# Patient Record
Sex: Male | Born: 1975 | Race: Black or African American | Hispanic: No | Marital: Single | Smoking: Never smoker
Health system: Southern US, Community
[De-identification: ages and names within clinical notes are randomized; demographics above are authoritative.]

## PROBLEM LIST (undated history)

## (undated) DIAGNOSIS — R7611 Nonspecific reaction to tuberculin skin test without active tuberculosis: Secondary | ICD-10-CM

## (undated) DIAGNOSIS — Z21 Asymptomatic human immunodeficiency virus [HIV] infection status: Secondary | ICD-10-CM

## (undated) DIAGNOSIS — I10 Essential (primary) hypertension: Secondary | ICD-10-CM

## (undated) DIAGNOSIS — B20 Human immunodeficiency virus [HIV] disease: Secondary | ICD-10-CM

## (undated) DIAGNOSIS — E785 Hyperlipidemia, unspecified: Secondary | ICD-10-CM

## (undated) DIAGNOSIS — N181 Chronic kidney disease, stage 1: Secondary | ICD-10-CM

## (undated) HISTORY — DX: Essential (primary) hypertension: I10

## (undated) HISTORY — DX: Chronic kidney disease, stage 1: N18.1

## (undated) HISTORY — DX: Human immunodeficiency virus (HIV) disease: B20

## (undated) HISTORY — DX: Hyperlipidemia, unspecified: E78.5

## (undated) HISTORY — DX: Asymptomatic human immunodeficiency virus (hiv) infection status: Z21

## (undated) HISTORY — PX: NO PAST SURGERIES: SHX2092

---

## 1995-12-05 DIAGNOSIS — R7611 Nonspecific reaction to tuberculin skin test without active tuberculosis: Secondary | ICD-10-CM

## 1995-12-05 HISTORY — DX: Nonspecific reaction to tuberculin skin test without active tuberculosis: R76.11

## 2011-01-30 ENCOUNTER — Emergency Department (HOSPITAL_COMMUNITY)
Admission: EM | Admit: 2011-01-30 | Discharge: 2011-01-30 | Disposition: A | Discharge status: Home / Self Care | Payer: Medicaid Other | Attending: Emergency Medicine | Admitting: Emergency Medicine

## 2011-01-30 DIAGNOSIS — Z21 Asymptomatic human immunodeficiency virus [HIV] infection status: Secondary | ICD-10-CM | POA: Insufficient documentation

## 2011-01-30 DIAGNOSIS — Z79899 Other long term (current) drug therapy: Secondary | ICD-10-CM | POA: Insufficient documentation

## 2011-01-30 DIAGNOSIS — J029 Acute pharyngitis, unspecified: Secondary | ICD-10-CM | POA: Insufficient documentation

## 2011-01-30 LAB — RAPID STREP SCREEN (MED CTR MEBANE ONLY): Streptococcus, Group A Screen (Direct): NEGATIVE

## 2011-03-12 ENCOUNTER — Ambulatory Visit (INDEPENDENT_AMBULATORY_CARE_PROVIDER_SITE_OTHER): Payer: Medicaid Other

## 2011-03-12 DIAGNOSIS — R7611 Nonspecific reaction to tuberculin skin test without active tuberculosis: Secondary | ICD-10-CM

## 2011-03-12 DIAGNOSIS — Z79899 Other long term (current) drug therapy: Secondary | ICD-10-CM

## 2011-03-12 DIAGNOSIS — Z113 Encounter for screening for infections with a predominantly sexual mode of transmission: Secondary | ICD-10-CM

## 2011-03-12 DIAGNOSIS — Z23 Encounter for immunization: Secondary | ICD-10-CM

## 2011-03-12 DIAGNOSIS — B20 Human immunodeficiency virus [HIV] disease: Secondary | ICD-10-CM

## 2011-03-12 LAB — COMPLETE METABOLIC PANEL WITH GFR
ALT: 33 U/L (ref 0–53)
AST: 26 U/L (ref 0–37)
Albumin: 4 g/dL (ref 3.5–5.2)
Calcium: 8.8 mg/dL (ref 8.4–10.5)
Chloride: 106 mEq/L (ref 96–112)
Creat: 1.19 mg/dL (ref 0.50–1.35)
Potassium: 4.3 mEq/L (ref 3.5–5.3)
Sodium: 140 mEq/L (ref 135–145)
Total Protein: 6.5 g/dL (ref 6.0–8.3)

## 2011-03-12 LAB — CBC WITH DIFFERENTIAL/PLATELET
Basophils Absolute: 0 10*3/uL (ref 0.0–0.1)
Lymphocytes Relative: 38 % (ref 12–46)
Lymphs Abs: 1.2 10*3/uL (ref 0.7–4.0)
Neutro Abs: 1.5 10*3/uL — ABNORMAL LOW (ref 1.7–7.7)
Neutrophils Relative %: 49 % (ref 43–77)
Platelets: 133 10*3/uL — ABNORMAL LOW (ref 150–400)
RBC: 4.74 MIL/uL (ref 4.22–5.81)
RDW: 12.8 % (ref 11.5–15.5)
WBC: 3.1 10*3/uL — ABNORMAL LOW (ref 4.0–10.5)

## 2011-03-12 LAB — LIPID PANEL
LDL Cholesterol: 161 mg/dL — ABNORMAL HIGH (ref 0–99)
Triglycerides: 67 mg/dL (ref <150)

## 2011-03-12 LAB — URINALYSIS
Hgb urine dipstick: NEGATIVE
Ketones, ur: NEGATIVE mg/dL
Leukocytes, UA: NEGATIVE
Nitrite: NEGATIVE
Specific Gravity, Urine: 1.025 (ref 1.005–1.030)
Urobilinogen, UA: 1 mg/dL (ref 0.0–1.0)

## 2011-03-12 LAB — HEPATITIS B SURFACE ANTIGEN: Hepatitis B Surface Ag: NEGATIVE

## 2011-03-12 LAB — HEPATITIS C ANTIBODY: HCV Ab: NEGATIVE

## 2011-03-13 LAB — GC/CHLAMYDIA PROBE AMP, URINE
Chlamydia, Swab/Urine, PCR: NEGATIVE
GC Probe Amp, Urine: NEGATIVE

## 2011-03-13 LAB — HEPATITIS A ANTIBODY, TOTAL: Hep A Total Ab: NEGATIVE

## 2011-03-14 LAB — HIV 1/2 CONFIRMATION: HIV-2 Ab: NEGATIVE

## 2011-03-14 LAB — HIV-1 RNA QUANT-NO REFLEX-BLD: HIV 1 RNA Quant: 372 copies/mL — ABNORMAL HIGH (ref <20)

## 2011-03-18 DIAGNOSIS — B20 Human immunodeficiency virus [HIV] disease: Secondary | ICD-10-CM | POA: Insufficient documentation

## 2011-03-18 NOTE — Progress Notes (Signed)
Pt has been without medication for 3 weeks prior to today.   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

## 2011-03-26 ENCOUNTER — Encounter: Payer: Self-pay | Admitting: Internal Medicine

## 2011-03-26 ENCOUNTER — Ambulatory Visit (INDEPENDENT_AMBULATORY_CARE_PROVIDER_SITE_OTHER): Payer: Medicaid Other | Admitting: Internal Medicine

## 2011-03-26 DIAGNOSIS — Z79899 Other long term (current) drug therapy: Secondary | ICD-10-CM

## 2011-03-26 DIAGNOSIS — R7611 Nonspecific reaction to tuberculin skin test without active tuberculosis: Secondary | ICD-10-CM

## 2011-03-26 DIAGNOSIS — Z23 Encounter for immunization: Secondary | ICD-10-CM

## 2011-03-26 DIAGNOSIS — B20 Human immunodeficiency virus [HIV] disease: Secondary | ICD-10-CM

## 2011-03-26 DIAGNOSIS — E7800 Pure hypercholesterolemia, unspecified: Secondary | ICD-10-CM

## 2011-03-26 MED ORDER — PRAVASTATIN SODIUM 20 MG PO TABS: 20.0000 mg | ORAL_TABLET | Freq: Every evening | ORAL | Status: DC

## 2011-03-26 MED ORDER — EFAVIRENZ-EMTRICITAB-TENOFOVIR 600-200-300 MG PO TABS: 1.0000 | ORAL_TABLET | Freq: Every day | ORAL | Status: DC

## 2011-03-26 MED ORDER — OMEGA-3 FATTY ACIDS 1000 MG PO CAPS: 2.0000 g | ORAL_CAPSULE | Freq: Two times a day (BID) | ORAL | Status: DC
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
## 2011-03-26 NOTE — Assessment & Plan Note (Signed)
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 He will have chest x-ray checked next visit for screening.

## 2011-03-26 NOTE — Assessment & Plan Note (Addendum)
After further discussion the patient does. This has been his only regimen that is ever been on and he has taken it well and has never developed any resistance dictations that he knows of. Looking through the records is no sign of any resistance mutations therefore I do feel he is eligible for any medication regimen. I did discuss the different options with him and we have decided on the Atripla which she will start today. I did discuss the side effects including C. And S. Disturbances which typically wear off, as well as rash, and kidney and liver side effects. I also discussed that he will take it on an empty stomach. He does appear motivated to start a new regimen and does feel he is very able to take a medicine every day and he has shown that by his long history of taking medications. Therefore he was prescribed Atripla.  I did discuss with him the need to continue using condoms. I also discussed the long-term effects of HIV and HIV medication including cardiac and renal dysfunction the need to continue to monitor those and control his cholesterol.  He will return in one month for labs to assure his immune system is improving again.

## 2011-03-26 NOTE — Patient Instructions (Signed)
Follow up labs in 4 weeks

## 2011-03-26 NOTE — Assessment & Plan Note (Signed)
The patient has been on pravastatin though he has not taken it since leaving jail. This has been restarted and his cholesterol will be rechecked at her later date. He also has fish oil for triglycerides and this also was arrived today.

## 2011-04-02 ENCOUNTER — Encounter (HOSPITAL_COMMUNITY): Payer: Self-pay | Admitting: Emergency Medicine

## 2011-04-02 ENCOUNTER — Telehealth: Payer: Self-pay | Admitting: *Deleted

## 2011-04-02 ENCOUNTER — Emergency Department (HOSPITAL_COMMUNITY)
Admission: EM | Admit: 2011-04-02 | Discharge: 2011-04-03 | Disposition: A | Discharge status: Home / Self Care | Payer: Medicaid Other | Attending: Emergency Medicine | Admitting: Emergency Medicine

## 2011-04-02 DIAGNOSIS — R05 Cough: Secondary | ICD-10-CM | POA: Insufficient documentation

## 2011-04-02 DIAGNOSIS — R070 Pain in throat: Secondary | ICD-10-CM | POA: Insufficient documentation

## 2011-04-02 DIAGNOSIS — J069 Acute upper respiratory infection, unspecified: Secondary | ICD-10-CM | POA: Insufficient documentation

## 2011-04-02 DIAGNOSIS — R059 Cough, unspecified: Secondary | ICD-10-CM | POA: Insufficient documentation

## 2011-04-02 DIAGNOSIS — R0602 Shortness of breath: Secondary | ICD-10-CM | POA: Insufficient documentation

## 2011-04-02 DIAGNOSIS — Z79899 Other long term (current) drug therapy: Secondary | ICD-10-CM | POA: Insufficient documentation

## 2011-04-02 NOTE — Telephone Encounter (Signed)
States he started the Atripla 03/26/11. Has bad insomnia from this & a HA. Early am wakening & unable to go back to sleep. Most worrisome to him is the moderate SOB in am. States "sometimes it feels like my throat is closing up" then it stops. He has not missed any doses. He is a non-smoker. He is not overly worried about this. Told him he should be seen by a doctor. He has medicaid but no pcp. Does not want to go to UC. Stated he wanted to wait for appt here which is next Monday. Urged him to go to ED if it is worse than usual or if it does not go away promptly upon getting up, while he is waiting for Monday appt.  He states he thinks it is anxiety.  Transferred to front desk

## 2011-04-02 NOTE — ED Notes (Signed)
PT. REPORTS SOB , DRY COUGH , SORE THROAT /SWELLING X 2 DAYS.

## 2011-04-03 ENCOUNTER — Telehealth: Payer: Self-pay

## 2011-04-03 ENCOUNTER — Emergency Department (HOSPITAL_COMMUNITY): Payer: Medicaid Other

## 2011-04-03 ENCOUNTER — Other Ambulatory Visit: Payer: Self-pay | Admitting: Internal Medicine

## 2011-04-03 MED ORDER — AZITHROMYCIN 250 MG PO TABS: 250.0000 mg | ORAL_TABLET | Freq: Every day | ORAL | Status: AC

## 2011-04-03 MED ORDER — ALBUTEROL SULFATE HFA 108 (90 BASE) MCG/ACT IN AERS: 1.0000 | INHALATION_SPRAY | Freq: Four times a day (QID) | RESPIRATORY_TRACT | Status: DC | PRN

## 2011-04-03 MED ORDER — EMTRICITAB-RILPIVIR-TENOFOV DF 200-25-300 MG PO TABS: 1.0000 | ORAL_TABLET | Freq: Every day | ORAL | Status: DC

## 2011-04-03 MED ORDER — ALBUTEROL SULFATE (5 MG/ML) 0.5% IN NEBU
5.0000 mg | INHALATION_SOLUTION | Freq: Once | RESPIRATORY_TRACT | Status: AC
  Administered 2011-04-03: 5 mg via RESPIRATORY_TRACT
  Filled 2011-04-03: qty 1

## 2011-04-03 NOTE — ED Provider Notes (Signed)
Medical screening examination/treatment/procedure(s) were performed by non-physician practitioner and as supervising physician I was immediately available for consultation/collaboration.   Yuriana Gaal, MD 04/03/11 0611 

## 2011-04-03 NOTE — Telephone Encounter (Signed)
Pt says he has not been able to sleep since starting Atripla.  He is awaking through the night with anxiety and shortness of breath. He thinks it is related to the Atripla according to the insert this could be a side effect.  He stated he also went to the ED for eval on 04-02-11.   Per Dr Luciana Axe pt advised to d/c Atripla.  He will prescribe new medicaiton. Pt should call our office if symptoms continue.

## 2011-04-03 NOTE — ED Provider Notes (Signed)
History     CSN: [id number] Arrival date & time: 04/02/2011  9:33 PM   First MD Initiated Contact with Patient 04/03/11 0010      Chief Complaint  Patient presents with  . Shortness of Breath    (Consider location/radiation/quality/duration/timing/severity/associated sxs/prior treatment) Patient is a 35 y.o. male presenting with shortness of breath. The history is provided by the patient.  Shortness of Breath  Associated symptoms include shortness of breath.   Pt presents to the ED with complaints of SOB, sore throat and a dry cough. He denies fevers, chills, nausea and vomiting. He has magic mouth wash which helps some with his pain. Denies history of asthma.  Past Medical History  Diagnosis Date  . 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 Hyperlipidemia   . HIV infection     History reviewed. No pertinent past surgical history.  No family history on file.  History  Substance Use Topics  . Smoking status: Never Smoker   . Smokeless tobacco: Never Used  . Alcohol Use: No      Review of Systems  Respiratory: Positive for shortness of breath.   All other systems reviewed and are negative.    Allergies  Review of patient's allergies indicates no known allergies.  Home Medications   Current Outpatient Rx  Name Route Sig Dispense Refill  . EFAVIRENZ-EMTRICITAB-TENOFOVIR 600-200-300 MG PO TABS Oral Take 1 tablet by mouth at bedtime. 30 tablet 5  . OMEGA-3 FATTY ACIDS 1000 MG PO CAPS Oral Take 2 capsules (2 g total) by mouth 2 (two) times daily. 30 capsule 5  . PRAVASTATIN SODIUM 20 MG PO TABS Oral Take 1 tablet (20 mg total) by mouth every evening. 30 tablet 5    BP 124/82  Pulse 78  Temp(Src) 97.4 F (36.3 C) (Oral)  Resp 18  SpO2 99%  Physical Exam  Nursing note and vitals reviewed. Constitutional: He is oriented to person, place, and time. He appears well-developed and well-nourished.  HENT:  Head: Normocephalic and atraumatic.  Eyes:  EOM are normal. Pupils are equal, round, and reactive to light.  Neck: Normal range of motion.  Cardiovascular: Normal rate and regular rhythm.   Pulmonary/Chest: Effort normal and breath sounds normal.  Musculoskeletal: Normal range of motion.  Neurological: He is alert and oriented to person, place, and time.  Skin: Skin is warm and dry.    ED Course  Procedures (including critical care time)  Labs Reviewed - No data to display Dg Chest 2 View  04/03/2011  *RADIOLOGY REPORT*  Clinical Data: Cough.  Shortness of breath.  CHEST - 2 VIEW 04/03/2011:  Comparison: None.  Findings: Suboptimal inspiration accounts for crowded bronchovascular markings, especially in the lung bases, and accentuates the cardiac silhouette.  Taking this into account, cardiomediastinal silhouette unremarkable and lungs clear. Bronchovascular markings normal.  No pleural effusions.  Visualized bony thorax intact.  IMPRESSION: Suboptimal inspiration.  No acute cardiopulmonary disease.  Original Report Authenticated By: Arnell Sieving, M.D.     No diagnosis found.    MDM         Dorthula Matas, PA 04/03/11 [phone]

## 2011-04-08 ENCOUNTER — Encounter: Payer: Self-pay | Admitting: Internal Medicine

## 2011-04-08 ENCOUNTER — Ambulatory Visit (INDEPENDENT_AMBULATORY_CARE_PROVIDER_SITE_OTHER): Payer: Medicaid Other | Admitting: Internal Medicine

## 2011-04-08 VITALS — BP 131/84 | HR 84 | Temp 97.8°F | Wt 209.0 lb

## 2011-04-08 DIAGNOSIS — B20 Human immunodeficiency virus [HIV] disease: Secondary | ICD-10-CM

## 2011-04-08 NOTE — Assessment & Plan Note (Signed)
He has been switched to Complera and so far with one dose is tolerating it. I will have him come back in approximately 4 weeks to recheck his CD4 and viral load and have him followup 2 weeks after the lab draw. He was told to call if he has any significant problems with the Complera or other issues that arise. He was reminded of the need for for condom use with all sexual activity.

## 2011-04-08 NOTE — Progress Notes (Signed)
  Subjective:    Patient ID: Edward Archer, male    DOB: Nov 17, 1975, 35 y.o.   MRN: [id number]  HPI he comes in for followup for his HIV. He was recently started on Atripla however he began to have significant side effects including anxiety as well as difficulty sleeping. He then called and he was subsequently changed to Complera verbal consult which he has just started today. He does tell me that he is able to take the medication in the morning and does he eat breakfast daily and prefers a morning regimen. He so far with the one day has had no side effects from the Complera. He did recently go to the emergency room 2 to shortness of breath which he described as having difficulty breathing in. He was evaluated in the emergency room and given antibiotics and an inhaler. He states that it has improved significantly.    Review of Systems  Constitutional: Negative for fever, chills and fatigue.  HENT: Negative for sore throat and trouble swallowing.   Respiratory: Positive for shortness of breath. Negative for cough, choking, chest tightness, wheezing and stridor.   Cardiovascular: Negative for chest pain.  Gastrointestinal: Negative for nausea and diarrhea.  Musculoskeletal: Negative for myalgias and arthralgias.  Skin: Negative for rash.  Neurological: Negative for light-headedness and headaches.  Hematological: Negative for adenopathy.  Psychiatric/Behavioral: Positive for sleep disturbance and dysphoric mood. The patient is nervous/anxious.        With Atripla       Objective:   Physical Exam  Constitutional: He appears well-developed and well-nourished. No distress.  HENT:  Mouth/Throat: Oropharynx is clear and moist. No oropharyngeal exudate.       No throat edema  Eyes: Right eye exhibits no discharge. Left eye exhibits no discharge. No scleral icterus.  Cardiovascular: Normal rate, regular rhythm and normal heart sounds.  Exam reveals no gallop and no friction rub.   No murmur  heard. Pulmonary/Chest: Effort normal and breath sounds normal. No respiratory distress. He has no wheezes.  Abdominal: Soft. Bowel sounds are normal. There is no tenderness.  Lymphadenopathy:    He has no cervical adenopathy.  Skin: Skin is warm and dry. No rash noted.  Psychiatric: He has a normal mood and affect. His behavior is normal.          Assessment & Plan:

## 2011-04-08 NOTE — Patient Instructions (Signed)
Return in 4 weeks for labs and in January for appointment

## 2011-04-25 ENCOUNTER — Other Ambulatory Visit: Payer: Medicaid Other

## 2011-04-29 ENCOUNTER — Telehealth: Payer: Self-pay | Admitting: *Deleted

## 2011-04-29 NOTE — Telephone Encounter (Signed)
He wanted to check on when his appts were. I told him dates & times

## 2011-05-02 ENCOUNTER — Other Ambulatory Visit: Payer: Self-pay | Admitting: Infectious Diseases

## 2011-05-02 ENCOUNTER — Other Ambulatory Visit: Payer: Medicaid Other

## 2011-05-02 ENCOUNTER — Ambulatory Visit: Payer: Medicaid Other | Admitting: Internal Medicine

## 2011-05-02 DIAGNOSIS — B20 Human immunodeficiency virus [HIV] disease: Secondary | ICD-10-CM

## 2011-05-02 LAB — CBC WITH DIFFERENTIAL/PLATELET
Eosinophils Absolute: 0 10*3/uL (ref 0.0–0.7)
Hemoglobin: 15.3 g/dL (ref 13.0–17.0)
Lymphocytes Relative: 43 % (ref 12–46)
Lymphs Abs: 1.4 10*3/uL (ref 0.7–4.0)
MCH: 30.2 pg (ref 26.0–34.0)
Monocytes Relative: 8 % (ref 3–12)
Neutro Abs: 1.5 10*3/uL — ABNORMAL LOW (ref 1.7–7.7)
Neutrophils Relative %: 47 % (ref 43–77)
Platelets: 132 10*3/uL — ABNORMAL LOW (ref 150–400)
RBC: 5.07 MIL/uL (ref 4.22–5.81)
WBC: 3.2 10*3/uL — ABNORMAL LOW (ref 4.0–10.5)

## 2011-05-02 LAB — COMPLETE METABOLIC PANEL WITH GFR
ALT: 49 U/L (ref 0–53)
BUN: 21 mg/dL (ref 6–23)
CO2: 27 mEq/L (ref 19–32)
Calcium: 9 mg/dL (ref 8.4–10.5)
Creat: 1.23 mg/dL (ref 0.50–1.35)
GFR, Est African American: 87 mL/min
Total Bilirubin: 0.8 mg/dL (ref 0.3–1.2)

## 2011-05-03 LAB — T-HELPER CELL (CD4) - (RCID CLINIC ONLY)
CD4 % Helper T Cell: 29 % — ABNORMAL LOW (ref 33–55)
CD4 T Cell Abs: 420 uL (ref 400–2700)

## 2011-05-06 LAB — HIV-1 RNA QUANT-NO REFLEX-BLD: HIV 1 RNA Quant: 27 copies/mL — ABNORMAL HIGH (ref <20)

## 2011-05-08 ENCOUNTER — Other Ambulatory Visit: Payer: Medicaid Other

## 2011-05-09 ENCOUNTER — Other Ambulatory Visit: Payer: Medicaid Other

## 2011-05-16 ENCOUNTER — Ambulatory Visit: Payer: Medicaid Other | Admitting: Internal Medicine

## 2011-05-21 ENCOUNTER — Ambulatory Visit (INDEPENDENT_AMBULATORY_CARE_PROVIDER_SITE_OTHER): Payer: Medicaid Other | Admitting: Internal Medicine

## 2011-05-21 ENCOUNTER — Encounter: Payer: Self-pay | Admitting: Internal Medicine

## 2011-05-21 VITALS — BP 128/74 | HR 80 | Temp 97.9°F | Ht 73.0 in | Wt 215.8 lb

## 2011-05-21 DIAGNOSIS — B20 Human immunodeficiency virus [HIV] disease: Secondary | ICD-10-CM

## 2011-05-21 MED ORDER — CLOTRIMAZOLE 1 % EX CREA: TOPICAL_CREAM | Freq: Two times a day (BID) | CUTANEOUS | Status: DC

## 2011-05-21 NOTE — Patient Instructions (Signed)
Follow up in 3 months

## 2011-05-21 NOTE — Assessment & Plan Note (Signed)
The new regimen is doing well with good CD4 and viral load.  He will continue with this I discussed with him the need for condoms with all sexual activity.  I also discussed the need to increase his activity and exercise as well as healthy eating.  He will return for follow up in 3 months.

## 2011-05-21 NOTE — Progress Notes (Signed)
  Subjective:    Patient ID: Edward Archer, male    DOB: 1976/05/13, 36 y.o.   MRN: [id number]  HPI He comes in for follow up after starting Complera.  He initially was started on Atripla but was switched due to CNS side effects.  Since starting Complera, he has had no issues and is pleased with the new pill.  He does take it with a fatty meal and the only complaint is the weight gain. No recent STIs or OIs.      Review of Systems  Constitutional: Negative for fever, appetite change, fatigue and unexpected weight change.  HENT: Negative for trouble swallowing.   Respiratory: Negative for cough.   Cardiovascular: Negative for leg swelling.  Gastrointestinal: Negative for nausea, abdominal pain and diarrhea.  Genitourinary: Negative for discharge and penile pain.  Musculoskeletal: Negative for myalgias and arthralgias.  Skin: Negative for rash.  Neurological: Negative for headaches.  Hematological: Negative for adenopathy.  Psychiatric/Behavioral: Negative for dysphoric mood. The patient is not nervous/anxious.        Objective:   Physical Exam  Constitutional: He is oriented to person, place, and time. He appears well-developed and well-nourished. No distress.  HENT:  Mouth/Throat: Oropharynx is clear and moist. No oropharyngeal exudate.  Cardiovascular: Normal rate, regular rhythm and normal heart sounds.  Exam reveals no gallop and no friction rub.   No murmur heard. Pulmonary/Chest: Effort normal and breath sounds normal. No respiratory distress. He has no wheezes.  Abdominal: Soft. Bowel sounds are normal. He exhibits no distension. There is no tenderness.  Lymphadenopathy:    He has no cervical adenopathy.  Neurological: He is alert and oriented to person, place, and time.  Skin: Skin is warm and dry. No erythema.  Psychiatric: He has a normal mood and affect. His behavior is normal.          Assessment & Plan:

## 2011-07-18 ENCOUNTER — Other Ambulatory Visit: Payer: Self-pay | Admitting: *Deleted

## 2011-07-18 ENCOUNTER — Ambulatory Visit: Payer: Medicaid Other

## 2011-07-18 DIAGNOSIS — B20 Human immunodeficiency virus [HIV] disease: Secondary | ICD-10-CM

## 2011-07-18 MED ORDER — EMTRICITAB-RILPIVIR-TENOFOV DF 200-25-300 MG PO TABS: 1.0000 | ORAL_TABLET | Freq: Every day | ORAL | Status: DC

## 2011-08-13 ENCOUNTER — Other Ambulatory Visit: Payer: Self-pay | Admitting: *Deleted

## 2011-08-13 DIAGNOSIS — B20 Human immunodeficiency virus [HIV] disease: Secondary | ICD-10-CM

## 2011-08-13 MED ORDER — EMTRICITAB-RILPIVIR-TENOFOV DF 200-25-300 MG PO TABS: 1.0000 | ORAL_TABLET | Freq: Every day | ORAL | Status: DC

## 2011-08-19 ENCOUNTER — Other Ambulatory Visit (INDEPENDENT_AMBULATORY_CARE_PROVIDER_SITE_OTHER): Payer: Self-pay

## 2011-08-19 DIAGNOSIS — B20 Human immunodeficiency virus [HIV] disease: Secondary | ICD-10-CM

## 2011-08-20 LAB — COMPREHENSIVE METABOLIC PANEL
ALT: 44 U/L (ref 0–53)
AST: 33 U/L (ref 0–37)
Albumin: 4.2 g/dL (ref 3.5–5.2)
Alkaline Phosphatase: 56 U/L (ref 39–117)
BUN: 20 mg/dL (ref 6–23)
Chloride: 108 mEq/L (ref 96–112)
Creat: 1.3 mg/dL (ref 0.50–1.35)
Potassium: 4.3 mEq/L (ref 3.5–5.3)

## 2011-08-20 LAB — CBC WITH DIFFERENTIAL/PLATELET
Basophils Absolute: 0 10*3/uL (ref 0.0–0.1)
Basophils Relative: 0 % (ref 0–1)
HCT: 46 % (ref 39.0–52.0)
Lymphocytes Relative: 39 % (ref 12–46)
MCHC: 33 g/dL (ref 30.0–36.0)
Monocytes Absolute: 0.4 10*3/uL (ref 0.1–1.0)
Neutro Abs: 2.4 10*3/uL (ref 1.7–7.7)
Platelets: 155 10*3/uL (ref 150–400)
RDW: 13.2 % (ref 11.5–15.5)
WBC: 4.8 10*3/uL (ref 4.0–10.5)

## 2011-08-21 LAB — HIV-1 RNA QUANT-NO REFLEX-BLD
HIV 1 RNA Quant: 750 copies/mL — ABNORMAL HIGH (ref <20)
HIV-1 RNA Quant, Log: 2.88 {Log} — ABNORMAL HIGH (ref <1.30)

## 2011-09-02 ENCOUNTER — Encounter: Payer: Self-pay | Admitting: Internal Medicine

## 2011-09-02 ENCOUNTER — Ambulatory Visit (INDEPENDENT_AMBULATORY_CARE_PROVIDER_SITE_OTHER): Payer: Self-pay | Admitting: Internal Medicine

## 2011-09-02 VITALS — BP 135/85 | HR 71 | Temp 97.4°F | Ht 73.0 in | Wt 227.0 lb

## 2011-09-02 DIAGNOSIS — Z21 Asymptomatic human immunodeficiency virus [HIV] infection status: Secondary | ICD-10-CM

## 2011-09-02 DIAGNOSIS — Z23 Encounter for immunization: Secondary | ICD-10-CM

## 2011-09-02 DIAGNOSIS — Z79899 Other long term (current) drug therapy: Secondary | ICD-10-CM

## 2011-09-02 DIAGNOSIS — B20 Human immunodeficiency virus [HIV] disease: Secondary | ICD-10-CM

## 2011-09-02 MED ORDER — PRAVASTATIN SODIUM 20 MG PO TABS: 20.0000 mg | ORAL_TABLET | Freq: Every evening | ORAL | Status: DC

## 2011-09-02 MED ORDER — ALBUTEROL SULFATE HFA 108 (90 BASE) MCG/ACT IN AERS: 1.0000 | INHALATION_SPRAY | Freq: Four times a day (QID) | RESPIRATORY_TRACT | Status: DC | PRN

## 2011-09-02 MED ORDER — EMTRICITAB-RILPIVIR-TENOFOV DF 200-25-300 MG PO TABS: 1.0000 | ORAL_TABLET | Freq: Every day | ORAL | Status: DC

## 2011-09-02 NOTE — Assessment & Plan Note (Signed)
Restart Complera, labs in 4 weeks and follow up with me after.  Reminded to use condoms.

## 2011-09-02 NOTE — Progress Notes (Signed)
  Subjective:    Patient ID: Edward Archer, male    DOB: [birth year], 36 y.o.   MRN: [id number]  HPI Recently started on Complera and had good tolerance and excellent compliance.  Unfortunatley, lost medicaid and now is to get [facility].  Has been off about 1 month.  No new issues.    Review of Systems  Constitutional: Negative for fever, fatigue and unexpected weight change.  HENT: Negative for sore throat and trouble swallowing.   Respiratory: Negative for cough, shortness of breath and wheezing.   Cardiovascular: Negative for chest pain, palpitations and leg swelling.  Gastrointestinal: Negative for nausea, abdominal pain and diarrhea.  Musculoskeletal: Negative for myalgias, joint swelling and arthralgias.  Skin: Negative for rash.  Neurological: Negative for dizziness and light-headedness.  Hematological: Negative for adenopathy.  Psychiatric/Behavioral: Negative for dysphoric mood. The patient is not nervous/anxious.        Objective:   Physical Exam  Constitutional: He appears well-developed and well-nourished. No distress.  Cardiovascular: Normal rate, regular rhythm and normal heart sounds.  Exam reveals no gallop and no friction rub.   No murmur heard. Pulmonary/Chest: Effort normal and breath sounds normal. No respiratory distress. He has no wheezes. He has no rales.  Abdominal: Soft. Bowel sounds are normal. He exhibits no distension. There is no tenderness. There is no rebound.          Assessment & Plan:

## 2011-09-03 ENCOUNTER — Telehealth: Payer: Self-pay

## 2011-09-03 NOTE — Telephone Encounter (Signed)
Called Elena Sessoms at [facility] and told her patient could not get meds though he was approved on 08/01/11 - he came in yesterday and told Dr he could not get meds when he went to [employer] [facility] [employer]. I  had him on phone while he was at [facility] on [address] yesterday, and gave them his case # - told Michelle Nasuti that it had been over a month since patient was there and he is not the type to let us know what's going on - found out about  It since he came to Dr yesterday. Michelle Nasuti said they would look into this - I gave her all his info, case # etc. Also, told her this was not the first time something like this has happened and has occurred with other patients who have been on the program for awhile - also, [facility] does not let us know when they need scripts. We have been sending electronically once new patient has been approved, but apparently is not translating into their system.  She said she would call back and let me know what is going on - she said [facility] is also supposed to call them if someone is picking up meds and check on approval status if not in system.

## 2011-10-22 ENCOUNTER — Other Ambulatory Visit: Payer: Self-pay

## 2011-11-05 ENCOUNTER — Encounter: Payer: Self-pay | Admitting: Internal Medicine

## 2011-11-05 ENCOUNTER — Ambulatory Visit (INDEPENDENT_AMBULATORY_CARE_PROVIDER_SITE_OTHER): Payer: Medicaid Other | Admitting: Internal Medicine

## 2011-11-05 VITALS — BP 124/82 | HR 75 | Temp 97.6°F | Ht 73.0 in | Wt 225.0 lb

## 2011-11-05 DIAGNOSIS — B20 Human immunodeficiency virus [HIV] disease: Secondary | ICD-10-CM

## 2011-11-05 DIAGNOSIS — Z23 Encounter for immunization: Secondary | ICD-10-CM

## 2011-11-05 NOTE — Progress Notes (Signed)
  Subjective:    Patient ID: Edward Archer, male    DOB: Nov 27, 1975, 36 y.o.   MRN: [id number]  HPI He comes in for follow up of his HIV. Last year he had started on Complera however he had stopped it at one point after losing his Medicaid. He returned in April of this year and had been off medications for one month. He was then started on the eighth Program and restarted Complera about 2 months ago. His labs do show good immune reconstitution with an improvement in his CD4 count to over 500 and a viral load that is less than 1000. He feels well and likes his regimen. He does feel he is gaining weight due to the need to take it with food. He also tells me that he occasionally takes the dose in the afternoon when normally he takes it in the morning.   Review of Systems  Constitutional: Negative for fever, chills, fatigue and unexpected weight change.  HENT: Negative for sore throat and trouble swallowing.   Respiratory: Negative for cough and shortness of breath.   Cardiovascular: Negative for chest pain, palpitations and leg swelling.  Gastrointestinal: Negative for nausea, abdominal pain and diarrhea.  Musculoskeletal: Negative for myalgias, joint swelling and arthralgias.  Skin: Negative for rash.  Neurological: Negative for dizziness and headaches.  Hematological: Negative for adenopathy.  Psychiatric/Behavioral: Negative for dysphoric mood. The patient is not nervous/anxious.        Objective:   Physical Exam  Constitutional: He appears well-developed and well-nourished. No distress.  HENT:  Mouth/Throat: Oropharynx is clear and moist. No oropharyngeal exudate.  Cardiovascular: Normal rate, regular rhythm and normal heart sounds.  Exam reveals no gallop and no friction rub.   No murmur heard. Pulmonary/Chest: Effort normal and breath sounds normal. No respiratory distress. He has no wheezes. He has no rales.  Abdominal: Soft. Bowel sounds are normal. He exhibits no distension. There is no  tenderness.  Lymphadenopathy:    He has no cervical adenopathy.          Assessment & Plan:

## 2011-11-05 NOTE — Assessment & Plan Note (Signed)
He is doing well with his current regimen and I did encourage him to make sure he takes it at the same time every day. He does voice his understanding and will take it at the same time in the morning with food. I did offer to change the regimen that has less requirements for food however at this time he is interested in continuing with the same regimen. I will followup with him in 3 months to assure continued immune constitution and tolerance. He was reminded to call at anytime if he has significant issues and not stop the medications without calling us first.  He is going to return to his primary physician at [facility].

## 2011-11-18 ENCOUNTER — Emergency Department (HOSPITAL_COMMUNITY)
Admission: EM | Admit: 2011-11-18 | Discharge: 2011-11-18 | Disposition: A | Discharge status: Home / Self Care | Payer: Self-pay | Attending: Emergency Medicine | Admitting: Emergency Medicine

## 2011-11-18 ENCOUNTER — Encounter (HOSPITAL_COMMUNITY): Payer: Self-pay | Admitting: Emergency Medicine

## 2011-11-18 DIAGNOSIS — R109 Unspecified abdominal pain: Secondary | ICD-10-CM | POA: Insufficient documentation

## 2011-11-18 DIAGNOSIS — R10819 Abdominal tenderness, unspecified site: Secondary | ICD-10-CM | POA: Insufficient documentation

## 2011-11-18 DIAGNOSIS — R112 Nausea with vomiting, unspecified: Secondary | ICD-10-CM | POA: Insufficient documentation

## 2011-11-18 LAB — COMPREHENSIVE METABOLIC PANEL
ALT: 48 U/L (ref 0–53)
AST: 34 U/L (ref 0–37)
Albumin: 3.6 g/dL (ref 3.5–5.2)
Calcium: 8.7 mg/dL (ref 8.4–10.5)
Creatinine, Ser: 1.25 mg/dL (ref 0.50–1.35)
Sodium: 137 mEq/L (ref 135–145)

## 2011-11-18 LAB — CBC WITH DIFFERENTIAL/PLATELET
Basophils Absolute: 0 10*3/uL (ref 0.0–0.1)
Eosinophils Absolute: 0.1 10*3/uL (ref 0.0–0.7)
Eosinophils Relative: 3 % (ref 0–5)
Lymphocytes Relative: 47 % — ABNORMAL HIGH (ref 12–46)
MCH: 29.2 pg (ref 26.0–34.0)
MCV: 87.9 fL (ref 78.0–100.0)
Neutrophils Relative %: 39 % — ABNORMAL LOW (ref 43–77)
Platelets: 133 10*3/uL — ABNORMAL LOW (ref 150–400)
RBC: 4.97 MIL/uL (ref 4.22–5.81)
RDW: 13.3 % (ref 11.5–15.5)
WBC: 4.4 10*3/uL (ref 4.0–10.5)

## 2011-11-18 LAB — URINALYSIS, ROUTINE W REFLEX MICROSCOPIC
Hgb urine dipstick: NEGATIVE
Nitrite: NEGATIVE
Protein, ur: NEGATIVE mg/dL
Specific Gravity, Urine: 1.023 (ref 1.005–1.030)
Urobilinogen, UA: 0.2 mg/dL (ref 0.0–1.0)

## 2011-11-18 LAB — LIPASE, BLOOD: Lipase: 22 U/L (ref 11–59)

## 2011-11-18 MED ORDER — GI COCKTAIL ~~LOC~~
30.0000 mL | Freq: Once | ORAL | Status: AC
  Administered 2011-11-18: 30 mL via ORAL
  Filled 2011-11-18: qty 30

## 2011-11-18 MED ORDER — OMEPRAZOLE 20 MG PO CPDR: 20.0000 mg | DELAYED_RELEASE_CAPSULE | Freq: Every day | ORAL | Status: DC

## 2011-11-18 NOTE — ED Provider Notes (Signed)
Complains of epigastric pain onset yesterday feels like "bubbling". Feels like gastric reflux he's had in the past. Vomited 2 times last vomited 2 days ago. A hot toxin hamburgers yesterday. Pain feels much improved since treated with GI cocktail here. On exam no distress lungs clear auscultation heart regular rate and rhythm abdomen nondistended minimally tender at epigastrium no guarding rigidity or rebound.  Doug Sou, MD 11/18/11 1046

## 2011-11-18 NOTE — ED Notes (Signed)
Pt states "my stomach feels like it's in knots".  Pt c/o symptoms x 1 week.

## 2011-11-18 NOTE — ED Provider Notes (Signed)
Medical screening examination/treatment/procedure(s) were conducted as a shared visit with non-physician practitioner(s) and myself.  I personally evaluated the patient during the encounter  Doug Sou, MD 11/18/11 1554

## 2011-11-18 NOTE — ED Provider Notes (Signed)
History     CSN: [id number]  Arrival date & time 11/18/11  4696   First MD Initiated Contact with Patient 11/18/11 [phone]      Chief Complaint  Patient presents with  . Abdominal Pain    (Consider location/radiation/quality/duration/timing/severity/associated sxs/prior treatment) HPI Comments: Patient comes in today with a chief complaint of abdominal pain for the past week.  Pain located in the LUQ and epigastric area.  He describes the pain as a "bubbly" feeling.  He reports that he had two episodes of vomiting two days ago.  No vomiting since that time.  No blood in his emesis.  No diarrhea or constipation.  Last BM was two hours ago and was soft.  No blood in his stool.  No fever or chills.  He reports that he has a prior history of GERD.  He has taken Prilosec for this in the past, but has not taken any medication recently.  He also has a history of HIV and is followed by ID.  His last ID visit was 11/05/11 and everything was thought to be stable at that time.  His last CD4 count was 520 in April 2013.  His PCP is Risk analyst.  Patient is a 36 y.o. male presenting with abdominal pain. The history is provided by the patient.  Abdominal Pain The primary symptoms of the illness include abdominal pain, nausea and vomiting. The primary symptoms of the illness do not include fever, shortness of breath, diarrhea, hematemesis, hematochezia or dysuria. Episode onset: one week ago. The onset of the illness was gradual. The problem has not changed since onset. The patient has not had a change in bowel habit. Symptoms associated with the illness do not include chills, constipation, urgency, hematuria or frequency. Significant associated medical issues include HIV.    Past Medical History  Diagnosis Date  . 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 Hyperlipidemia   . HIV infection     History reviewed. No pertinent past surgical history.  History reviewed. No pertinent family  history.  History  Substance Use Topics  . Smoking status: Never Smoker   . Smokeless tobacco: Never Used  . Alcohol Use: No      Review of Systems  Constitutional: Negative for fever and chills.  Respiratory: Negative for shortness of breath.   Cardiovascular: Negative for chest pain.  Gastrointestinal: Positive for nausea, vomiting and abdominal pain. Negative for diarrhea, constipation, blood in stool, hematochezia, abdominal distention and hematemesis.  Genitourinary: Negative for dysuria, urgency, frequency, hematuria, flank pain, decreased urine volume, penile swelling, scrotal swelling, genital sores, penile pain and testicular pain.  Neurological: Negative for dizziness, syncope and light-headedness.    Allergies  Review of patient's allergies indicates no known allergies.  Home Medications   Current Outpatient Rx  Name Route Sig Dispense Refill  . BISACODYL 5 MG PO TBEC Oral Take 5 mg by mouth daily as needed. For laxative    . EMTRICITAB-RILPIVIR-TENOFOVIR 200-25-300 MG PO TABS Oral Take 1 tablet by mouth daily. 30 tablet 11  . OMEGA-3 FATTY ACIDS 1000 MG PO CAPS Oral Take 2 capsules (2 g total) by mouth 2 (two) times daily. 30 capsule 5    BP 113/71  Pulse 71  Temp 97.6 F (36.4 C) (Oral)  Resp 18  SpO2 98%  Physical Exam  Nursing note and vitals reviewed. Constitutional: He appears well-developed and well-nourished. No distress.  HENT:  Head: Normocephalic and atraumatic.  Mouth/Throat: Oropharynx is clear and  moist.  Neck: Normal range of motion. Neck supple.  Cardiovascular: Normal rate, regular rhythm and normal heart sounds.   Pulmonary/Chest: Effort normal and breath sounds normal.  Abdominal: Soft. Bowel sounds are normal. He exhibits no distension and no mass. There is tenderness in the left upper quadrant. There is no rigidity, no rebound, no guarding and no CVA tenderness.       Very mild tenderness to palpation of the LUQ and epigastric area    Neurological: He is alert.  Skin: Skin is warm and dry. He is not diaphoretic.  Psychiatric: He has a normal mood and affect.    ED Course  Procedures (including critical care time)   Labs Reviewed  URINALYSIS, ROUTINE W REFLEX MICROSCOPIC   No results found.   No diagnosis found.  10:05 AM Reassessed patient.  He reports that his symptoms have improved since he was given GI cocktail. 10:26 AM Patient reports that his pain is improved.  No nausea at this time.  MDM  Patient presenting with epigastric and LUQ abdominal pain.  Two episodes of vomiting two days ago, but no vomiting since that time.  VSS.  Patient afebrile.   No rebound or guarding on exam.  Labs unremarkable.  Pain improved after given GI cocktail.  Therefore, feel that patient can be discharged home.  Patient instructed to take Prilosec and Maalox.  Return precautions discussed.        Pascal Lux [place], PA-C 11/18/11 1129

## 2011-12-03 ENCOUNTER — Ambulatory Visit: Payer: Medicaid Other

## 2012-01-21 ENCOUNTER — Other Ambulatory Visit: Payer: Self-pay | Admitting: Internal Medicine

## 2012-01-21 DIAGNOSIS — Z113 Encounter for screening for infections with a predominantly sexual mode of transmission: Secondary | ICD-10-CM

## 2012-01-21 DIAGNOSIS — Z79899 Other long term (current) drug therapy: Secondary | ICD-10-CM

## 2012-01-23 ENCOUNTER — Other Ambulatory Visit (INDEPENDENT_AMBULATORY_CARE_PROVIDER_SITE_OTHER): Payer: Self-pay

## 2012-01-23 DIAGNOSIS — Z113 Encounter for screening for infections with a predominantly sexual mode of transmission: Secondary | ICD-10-CM

## 2012-01-23 DIAGNOSIS — Z79899 Other long term (current) drug therapy: Secondary | ICD-10-CM

## 2012-01-23 DIAGNOSIS — B20 Human immunodeficiency virus [HIV] disease: Secondary | ICD-10-CM

## 2012-01-23 LAB — CBC WITH DIFFERENTIAL/PLATELET
Basophils Absolute: 0 10*3/uL (ref 0.0–0.1)
Eosinophils Relative: 2 % (ref 0–5)
Lymphocytes Relative: 46 % (ref 12–46)
MCV: 87.4 fL (ref 78.0–100.0)
Neutro Abs: 1.9 10*3/uL (ref 1.7–7.7)
Neutrophils Relative %: 42 % — ABNORMAL LOW (ref 43–77)
Platelets: 161 10*3/uL (ref 150–400)
RDW: 14.2 % (ref 11.5–15.5)
WBC: 4.4 10*3/uL (ref 4.0–10.5)

## 2012-01-24 LAB — COMPLETE METABOLIC PANEL WITH GFR
Albumin: 4.1 g/dL (ref 3.5–5.2)
BUN: 17 mg/dL (ref 6–23)
CO2: 28 mEq/L (ref 19–32)
Calcium: 9 mg/dL (ref 8.4–10.5)
Chloride: 106 mEq/L (ref 96–112)
Creat: 1.3 mg/dL (ref 0.50–1.35)
GFR, Est African American: 81 mL/min
GFR, Est Non African American: 70 mL/min
Glucose, Bld: 100 mg/dL — ABNORMAL HIGH (ref 70–99)
Potassium: 4.2 mEq/L (ref 3.5–5.3)

## 2012-01-24 LAB — T-HELPER CELL (CD4) - (RCID CLINIC ONLY)
CD4 % Helper T Cell: 27 % — ABNORMAL LOW (ref 33–55)
CD4 T Cell Abs: 500 uL (ref 400–2700)

## 2012-01-24 LAB — LIPID PANEL
Total CHOL/HDL Ratio: 4 Ratio
VLDL: 14 mg/dL (ref 0–40)

## 2012-01-24 LAB — RPR

## 2012-01-26 LAB — HIV-1 RNA ULTRAQUANT REFLEX TO GENTYP+: HIV 1 RNA Quant: <20 copies/mL (ref <20)

## 2012-02-06 ENCOUNTER — Encounter: Payer: Self-pay | Admitting: Internal Medicine

## 2012-02-06 ENCOUNTER — Ambulatory Visit (INDEPENDENT_AMBULATORY_CARE_PROVIDER_SITE_OTHER): Payer: Self-pay | Admitting: Internal Medicine

## 2012-02-06 VITALS — BP 120/78 | HR 63 | Temp 97.4°F | Wt 227.0 lb

## 2012-02-06 DIAGNOSIS — Z23 Encounter for immunization: Secondary | ICD-10-CM

## 2012-02-06 DIAGNOSIS — B20 Human immunodeficiency virus [HIV] disease: Secondary | ICD-10-CM

## 2012-02-06 NOTE — Progress Notes (Signed)
  Subjective:    Patient ID: Markevius Trombetta, male    DOB: 1976-03-31, 36 y.o.   MRN: [id number]  HPI He comes in for followup of his HIV. He continues on Complera and has no missed doses. He has good tolerance though he does feel he has gained weight due to the food requirement. He is now undetectable with a good CD4 count.  He does like his regimen and will continue to take.   Review of Systems  Constitutional: Negative for fever, fatigue and unexpected weight change.  HENT: Negative for sore throat and trouble swallowing.   Respiratory: Negative for cough and shortness of breath.   Gastrointestinal: Negative for nausea, abdominal pain and diarrhea.  Musculoskeletal: Negative for myalgias, joint swelling and arthralgias.  Skin: Negative for rash.  Neurological: Negative for dizziness and headaches.  Hematological: Negative for adenopathy.       Objective:   Physical Exam  Constitutional: He appears well-developed and well-nourished. No distress.  HENT:  Mouth/Throat: Oropharynx is clear and moist. No oropharyngeal exudate.  Cardiovascular: Normal rate, regular rhythm and normal heart sounds.  Exam reveals no gallop and no friction rub.   No murmur heard. Pulmonary/Chest: Effort normal and breath sounds normal. No respiratory distress. He has no wheezes. He has no rales.  Abdominal: Soft. Bowel sounds are normal. He exhibits no distension. There is no tenderness. There is no rebound.  Lymphadenopathy:    He has no cervical adenopathy.          Assessment & Plan:

## 2012-02-06 NOTE — Assessment & Plan Note (Addendum)
He is doing well with his regimen. He will monitor use condoms for sexual activity. He will return in 4 months but knows to call sooner if he has any issues. He is going to reestablish with his previous primary care physician.

## 2012-06-02 ENCOUNTER — Other Ambulatory Visit (INDEPENDENT_AMBULATORY_CARE_PROVIDER_SITE_OTHER): Payer: Medicaid Other

## 2012-06-02 ENCOUNTER — Ambulatory Visit: Payer: Self-pay

## 2012-06-02 DIAGNOSIS — B20 Human immunodeficiency virus [HIV] disease: Secondary | ICD-10-CM

## 2012-06-03 LAB — T-HELPER CELL (CD4) - (RCID CLINIC ONLY): CD4 T Cell Abs: 520 uL (ref 400–2700)

## 2012-06-06 ENCOUNTER — Emergency Department (HOSPITAL_COMMUNITY)
Admission: EM | Admit: 2012-06-06 | Discharge: 2012-06-06 | Disposition: A | Discharge status: Home / Self Care | Payer: Medicaid Other | Attending: Emergency Medicine | Admitting: Emergency Medicine

## 2012-06-06 ENCOUNTER — Encounter (HOSPITAL_COMMUNITY): Payer: Self-pay | Admitting: Emergency Medicine

## 2012-06-06 ENCOUNTER — Emergency Department (HOSPITAL_COMMUNITY): Payer: Medicaid Other

## 2012-06-06 DIAGNOSIS — R071 Chest pain on breathing: Secondary | ICD-10-CM | POA: Insufficient documentation

## 2012-06-06 DIAGNOSIS — R079 Chest pain, unspecified: Secondary | ICD-10-CM

## 2012-06-06 DIAGNOSIS — Z79899 Other long term (current) drug therapy: Secondary | ICD-10-CM | POA: Insufficient documentation

## 2012-06-06 DIAGNOSIS — E785 Hyperlipidemia, unspecified: Secondary | ICD-10-CM | POA: Insufficient documentation

## 2012-06-06 DIAGNOSIS — Z21 Asymptomatic human immunodeficiency virus [HIV] infection status: Secondary | ICD-10-CM | POA: Insufficient documentation

## 2012-06-06 HISTORY — DX: Nonspecific reaction to tuberculin skin test without active tuberculosis: R76.11

## 2012-06-06 LAB — POCT I-STAT, CHEM 8
Calcium, Ion: 1.15 mmol/L (ref 1.12–1.23)
Creatinine, Ser: 1.4 mg/dL — ABNORMAL HIGH (ref 0.50–1.35)
Glucose, Bld: 100 mg/dL — ABNORMAL HIGH (ref 70–99)
Hemoglobin: 14.6 g/dL (ref 13.0–17.0)
Sodium: 141 mEq/L (ref 135–145)
TCO2: 24 mmol/L (ref 0–100)

## 2012-06-06 LAB — CBC
HCT: 42.6 % (ref 39.0–52.0)
Hemoglobin: 14.1 g/dL (ref 13.0–17.0)
MCH: 29.6 pg (ref 26.0–34.0)
MCHC: 33.1 g/dL (ref 30.0–36.0)

## 2012-06-06 LAB — POCT I-STAT TROPONIN I

## 2012-06-06 MED ORDER — ASPIRIN 81 MG PO CHEW
324.0000 mg | CHEWABLE_TABLET | Freq: Once | ORAL | Status: AC
  Administered 2012-06-06: 324 mg via ORAL
  Filled 2012-06-06: qty 4

## 2012-06-06 MED ORDER — MORPHINE SULFATE 4 MG/ML IJ SOLN
4.0000 mg | Freq: Once | INTRAMUSCULAR | Status: AC
  Administered 2012-06-06: 4 mg via INTRAVENOUS
  Filled 2012-06-06: qty 1

## 2012-06-06 MED ORDER — HYDROCODONE-ACETAMINOPHEN 5-325 MG PO TABS: 1.0000 | ORAL_TABLET | Freq: Four times a day (QID) | ORAL | Status: DC | PRN

## 2012-06-06 MED ORDER — CYCLOBENZAPRINE HCL 5 MG PO TABS: 5.0000 mg | ORAL_TABLET | Freq: Three times a day (TID) | ORAL | Status: DC | PRN

## 2012-06-06 MED ORDER — SODIUM CHLORIDE 0.9 % IV SOLN
1000.0000 mL | INTRAVENOUS | Status: DC
  Administered 2012-06-06: 1000 mL via INTRAVENOUS

## 2012-06-06 NOTE — ED Notes (Signed)
Pt from home c/o CP with mild SOB. Pt reports L sided CP x4 days that radiates to L side of back. Pt denies vomiting but reports nausea, diaphoresis. Pt in NAD and A&O

## 2012-06-06 NOTE — ED Provider Notes (Addendum)
History     CSN: [id number] Arrival date & time 06/06/12  1244 First MD Initiated Contact with Patient 06/06/12 1305      Chief Complaint  Patient presents with  . Chest Pain    Patient is a 37 y.o. male presenting with chest pain. The history is provided by the patient.  Chest Pain The chest pain began 3 - 5 days ago. Duration of episode(s) is 4 days. Chest pain occurs constantly. The chest pain is unchanged. The pain is associated with coughing (movement). The severity of the pain is moderate. The quality of the pain is described as aching. The pain radiates to the upper back. Chest pain is worsened by deep breathing (coughing, moving). Primary symptoms include cough. Pertinent negatives for primary symptoms include no fever, no shortness of breath, no vomiting and no dizziness.  Pertinent negatives for past medical history include no CAD and no PE.   CD4 counts good.  Viral load undetectable.  Past Medical History  Diagnosis Date  . 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 Hyperlipidemia   . HIV infection     History reviewed. No pertinent past surgical history.  No family history on file.  History  Substance Use Topics  . Smoking status: Never Smoker   . Smokeless tobacco: Never Used  . Alcohol Use: No      Review of Systems  Constitutional: Negative for fever.  Respiratory: Positive for cough. Negative for shortness of breath.   Cardiovascular: Positive for chest pain.  Gastrointestinal: Negative for vomiting.  Neurological: Negative for dizziness.  All other systems reviewed and are negative.    Allergies  Review of patient's allergies indicates no known allergies.  Home Medications   Current Outpatient Rx  Name  Route  Sig  Dispense  Refill  . EMTRICITAB-RILPIVIR-TENOFOVIR 200-25-300 MG PO TABS   Oral   Take 1 tablet by mouth every morning.         . IBUPROFEN 200 MG PO TABS   Oral   Take 800 mg by mouth every 6 (six) hours as needed.  For pain         . PRAVASTATIN SODIUM 20 MG PO TABS   Oral   Take 20 mg by mouth every morning.           BP 133/87  Pulse 81  Temp 98.2 F (36.8 C) (Oral)  Resp 16  Ht 6\' 1"  (1.854 m)  Wt 210 lb (95.255 kg)  BMI 27.71 kg/m2  SpO2 98%  Physical Exam  Nursing note and vitals reviewed. Constitutional: He appears well-developed and well-nourished. No distress.  HENT:  Head: Normocephalic and atraumatic.  Right Ear: External ear normal.  Left Ear: External ear normal.  Eyes: Conjunctivae normal are normal. Right eye exhibits no discharge. Left eye exhibits no discharge. No scleral icterus.  Neck: Neck supple. No tracheal deviation present.  Cardiovascular: Normal rate, regular rhythm and intact distal pulses.   Pulmonary/Chest: Effort normal and breath sounds normal. No stridor. No respiratory distress. He has no wheezes. He has no rales. He exhibits tenderness (left side).  Abdominal: Soft. Bowel sounds are normal. He exhibits no distension. There is no tenderness. There is no rebound and no guarding.  Musculoskeletal: He exhibits no edema and no tenderness.  Neurological: He is alert. He has normal strength. No sensory deficit. Cranial nerve deficit:  no gross defecits noted. He exhibits normal muscle tone. He displays no seizure activity. Coordination normal.  Skin:  Skin is warm and dry. No rash noted.  Psychiatric: He has a normal mood and affect.    ED Course  Procedures (including critical care time) EKG Sinus rhythm, rate 81 Normal axis, normal intervals Probable left atrial Melody Normal ST T waves No prior EKG for comparison  Labs Reviewed  POCT I-STAT, CHEM 8 - Abnormal; Notable for the following:    Creatinine, Ser 1.40 (*)     Glucose, Bld 100 (*)     All other components within normal limits  CBC  D-DIMER, QUANTITATIVE  POCT I-STAT TROPONIN I   Dg Chest 2 View  06/06/2012  *RADIOLOGY REPORT*  Clinical Data: Chest pain  CHEST - 2 VIEW  Comparison:  04/03/2011  Findings: Borderline cardiomegaly.  No acute infiltrate or pulmonary edema.  Bony thorax is stable.  No pleural effusion.  IMPRESSION: Borderline cardiomegaly.  No active disease.   Original Report Authenticated By: Natasha Mead, M.D.      1. Chest pain       MDM  Pt's chest pain is atypical for cardiac disease.  It has been constant for four days.  Troponin and d dimer negative.  Doubt PE, ACS.  No PNA noted on CXR.    Pt does have some chest wall ttp.  Will dc home with pain medications.  Follow up with PCP next week if symptoms persist.    Celene Kras, MD 06/06/12 1440  Discussed findings with patient.  He requested a muscle relaxer.  Celene Kras, MD 06/06/12 [phone]

## 2012-06-16 ENCOUNTER — Ambulatory Visit (INDEPENDENT_AMBULATORY_CARE_PROVIDER_SITE_OTHER): Payer: Medicaid Other | Admitting: Internal Medicine

## 2012-06-16 ENCOUNTER — Encounter: Payer: Self-pay | Admitting: Internal Medicine

## 2012-06-16 VITALS — BP 130/88 | HR 77 | Temp 98.3°F | Ht 73.0 in | Wt 227.0 lb

## 2012-06-16 DIAGNOSIS — Z113 Encounter for screening for infections with a predominantly sexual mode of transmission: Secondary | ICD-10-CM

## 2012-06-16 DIAGNOSIS — B20 Human immunodeficiency virus [HIV] disease: Secondary | ICD-10-CM

## 2012-06-16 DIAGNOSIS — Z79899 Other long term (current) drug therapy: Secondary | ICD-10-CM

## 2012-06-16 NOTE — Assessment & Plan Note (Signed)
He is doing well and will return in 6 months for fasting labs. He knows to call us if he has any problems in the meantime as we can see him anytime during the week in lieu of the emergency room if not a true emergency.

## 2012-06-16 NOTE — Progress Notes (Signed)
  Subjective:    Patient ID: Edward Archer, male    DOB: Nov 18, 1975, 37 y.o.   MRN: [id number]  HPI He comes in for followup of his HIV. He continues on Complera has excellent compliance and eats it with a 400-calorie meal in the a.m. Which she likes to do. He continues to be undetectable with a normal CD4 count. He was seen in the emergency room with muscle strain and this has resolved.   Review of Systems  Constitutional: Negative for fever, activity change and appetite change.  HENT: Negative for sore throat and trouble swallowing.   Respiratory: Negative for shortness of breath.   Gastrointestinal: Negative for nausea, abdominal pain and diarrhea.  Musculoskeletal: Negative for myalgias, back pain and arthralgias.  Skin: Negative for rash.       Objective:   Physical Exam  Constitutional: He appears well-developed and well-nourished. No distress.  Cardiovascular: Normal rate, regular rhythm and normal heart sounds.   No murmur heard. Pulmonary/Chest: Effort normal and breath sounds normal. No respiratory distress. He has no wheezes.          Assessment & Plan:

## 2012-06-29 ENCOUNTER — Other Ambulatory Visit: Payer: Self-pay | Admitting: *Deleted

## 2012-06-29 DIAGNOSIS — E7800 Pure hypercholesterolemia, unspecified: Secondary | ICD-10-CM

## 2012-06-29 MED ORDER — PRAVASTATIN SODIUM 20 MG PO TABS: 20.0000 mg | ORAL_TABLET | Freq: Every morning | ORAL | Status: DC

## 2012-08-13 ENCOUNTER — Encounter: Payer: Self-pay | Admitting: *Deleted

## 2012-08-26 ENCOUNTER — Other Ambulatory Visit: Payer: Self-pay | Admitting: *Deleted

## 2012-08-26 DIAGNOSIS — B20 Human immunodeficiency virus [HIV] disease: Secondary | ICD-10-CM

## 2012-08-26 MED ORDER — EMTRICITAB-RILPIVIR-TENOFOV DF 200-25-300 MG PO TABS: 1.0000 | ORAL_TABLET | Freq: Every morning | ORAL | Status: DC

## 2012-09-19 ENCOUNTER — Emergency Department (HOSPITAL_COMMUNITY)
Admission: EM | Admit: 2012-09-19 | Discharge: 2012-09-19 | Disposition: A | Discharge status: Home / Self Care | Payer: Self-pay | Attending: Emergency Medicine | Admitting: Emergency Medicine

## 2012-09-19 ENCOUNTER — Encounter (HOSPITAL_COMMUNITY): Payer: Self-pay | Admitting: Cardiology

## 2012-09-19 ENCOUNTER — Emergency Department (HOSPITAL_COMMUNITY): Payer: Self-pay

## 2012-09-19 DIAGNOSIS — Z21 Asymptomatic human immunodeficiency virus [HIV] infection status: Secondary | ICD-10-CM | POA: Insufficient documentation

## 2012-09-19 DIAGNOSIS — N200 Calculus of kidney: Secondary | ICD-10-CM | POA: Insufficient documentation

## 2012-09-19 DIAGNOSIS — E785 Hyperlipidemia, unspecified: Secondary | ICD-10-CM | POA: Insufficient documentation

## 2012-09-19 DIAGNOSIS — Z79899 Other long term (current) drug therapy: Secondary | ICD-10-CM | POA: Insufficient documentation

## 2012-09-19 LAB — CBC WITH DIFFERENTIAL/PLATELET
Basophils Absolute: 0 10*3/uL (ref 0.0–0.1)
Basophils Relative: 0 % (ref 0–1)
Eosinophils Absolute: 0.2 10*3/uL (ref 0.0–0.7)
Eosinophils Relative: 5 % (ref 0–5)
HCT: 43.1 % (ref 39.0–52.0)
Hemoglobin: 14.5 g/dL (ref 13.0–17.0)
MCH: 29.7 pg (ref 26.0–34.0)
MCHC: 33.6 g/dL (ref 30.0–36.0)
MCV: 88.3 fL (ref 78.0–100.0)
Monocytes Absolute: 0.3 10*3/uL (ref 0.1–1.0)
Monocytes Relative: 8 % (ref 3–12)
RDW: 13.4 % (ref 11.5–15.5)

## 2012-09-19 LAB — COMPREHENSIVE METABOLIC PANEL
AST: 28 U/L (ref 0–37)
Albumin: 3.5 g/dL (ref 3.5–5.2)
BUN: 18 mg/dL (ref 6–23)
Calcium: 8.6 mg/dL (ref 8.4–10.5)
Creatinine, Ser: 1.41 mg/dL — ABNORMAL HIGH (ref 0.50–1.35)
Total Bilirubin: 0.6 mg/dL (ref 0.3–1.2)

## 2012-09-19 LAB — URINE MICROSCOPIC-ADD ON

## 2012-09-19 LAB — URINALYSIS, ROUTINE W REFLEX MICROSCOPIC
Glucose, UA: NEGATIVE mg/dL
Ketones, ur: NEGATIVE mg/dL
Leukocytes, UA: NEGATIVE
Nitrite: NEGATIVE
Protein, ur: NEGATIVE mg/dL
pH: 5.5 (ref 5.0–8.0)

## 2012-09-19 LAB — LIPASE, BLOOD: Lipase: 23 U/L (ref 11–59)

## 2012-09-19 MED ORDER — IOHEXOL 300 MG/ML  SOLN
100.0000 mL | Freq: Once | INTRAMUSCULAR | Status: AC | PRN
  Administered 2012-09-19: 100 mL via INTRAVENOUS

## 2012-09-19 MED ORDER — HYDROCODONE-ACETAMINOPHEN 5-325 MG PO TABS: 1.0000 | ORAL_TABLET | Freq: Four times a day (QID) | ORAL | Status: DC | PRN

## 2012-09-19 MED ORDER — SODIUM CHLORIDE 0.9 % IV SOLN
Freq: Once | INTRAVENOUS | Status: AC
  Administered 2012-09-19: 12:00:00 via INTRAVENOUS

## 2012-09-19 MED ORDER — MORPHINE SULFATE 4 MG/ML IJ SOLN
4.0000 mg | Freq: Once | INTRAMUSCULAR | Status: AC
  Administered 2012-09-19: 4 mg via INTRAVENOUS
  Filled 2012-09-19: qty 1

## 2012-09-19 MED ORDER — IOHEXOL 300 MG/ML  SOLN
50.0000 mL | Freq: Once | INTRAMUSCULAR | Status: AC | PRN
  Administered 2012-09-19: 50 mL via ORAL

## 2012-09-19 MED ORDER — ONDANSETRON 4 MG PO TBDP
4.0000 mg | ORAL_TABLET | Freq: Once | ORAL | Status: AC
  Administered 2012-09-19: 4 mg via ORAL
  Filled 2012-09-19: qty 1

## 2012-09-19 NOTE — ED Notes (Signed)
Pt reports abd pain that started about 20 minutes ago. States no n/v or urinary symptoms. Reports some tenderness with palpation. Reports pain radiates around to his back. Increased pain with BM this morning.

## 2012-09-19 NOTE — ED Provider Notes (Signed)
Medical screening examination/treatment/procedure(s) were performed by non-physician practitioner and as supervising physician I was immediately available for consultation/collaboration.  Hurman Horn, MD 09/19/12 2206

## 2012-09-19 NOTE — ED Provider Notes (Signed)
History     CSN: [id number]  Arrival date & time 09/19/12  1100   First MD Initiated Contact with Patient 09/19/12 1106      Chief Complaint  Patient presents with  . Abdominal Pain    (Consider location/radiation/quality/duration/timing/severity/associated sxs/prior treatment) HPI  37 year old male with a past medical history of HIV presents emergency department with chief complaint of right lower quadrant abdominal pain.  Patient states his pain came on suddenly approximately 30 minutes prior to arrival.  He complains of severe pain in the right lower quadrant.  Patient states that it is progressively worsening he denies any colicky type of pain.  Patient denies any past history of hematochezia, melena, constipation or diarrhea.  He denies any current nausea or vomiting.  He is afebrile denies any history of malaise, chills, fever.  She has never had abdominal pain like this before.  He states that he is taking his medications daily as directed.  He is unsure of his last CD4 or viral load but states that he thinks they were undetectable.  He is followed by Dr. Luciana Axe [facility] the [facility]. Denies fevers, chills, myalgias, arthralgias. Denies DOE, SOB, chest tightness or pressure, radiation to left arm, jaw or back, or diaphoresis. Denies dysuria, flank pain, suprapubic pain, frequency, urgency, or hematuria. Denies headaches, light headedness, weakness, visual disturbances.     Past Medical History  Diagnosis Date  . 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 Hyperlipidemia   . HIV infection     History reviewed. No pertinent past surgical history.  History reviewed. No pertinent family history.  History  Substance Use Topics  . Smoking status: Never Smoker   . Smokeless tobacco: Never Used  . Alcohol Use: No      Review of Systems Ten systems reviewed and are negative for acute change, except as noted in the HPI.    Allergies  Review of  patient's allergies indicates no known allergies.  Home Medications   Current Outpatient Rx  Name  Route  Sig  Dispense  Refill  . cyclobenzaprine (FLEXERIL) 5 MG tablet   Oral   Take 1 tablet (5 mg total) by mouth 3 (three) times daily as needed for muscle spasms.   21 tablet   0   . Emtricitab-Rilpivir-Tenofovir (COMPLERA) 200-25-300 MG TABS   Oral   Take 1 tablet by mouth every morning.   30 tablet   5   . HYDROcodone-acetaminophen (NORCO) 5-325 MG per tablet   Oral   Take 1-2 tablets by mouth every 6 (six) hours as needed for pain.   16 tablet   0   . ibuprofen (ADVIL,MOTRIN) 200 MG tablet   Oral   Take 800 mg by mouth every 6 (six) hours as needed. For pain         . pravastatin (PRAVACHOL) 20 MG tablet   Oral   Take 1 tablet (20 mg total) by mouth every morning.   30 tablet   5     BP 129/84  Pulse 73  Temp(Src) 96.5 F (35.8 C) (Oral)  Resp 20  SpO2 97%  Physical Exam  Nursing note and vitals reviewed. Constitutional: He appears well-developed and well-nourished. No distress.  And appears uncomfortable  HENT:  Head: Normocephalic and atraumatic.  Eyes: Conjunctivae are normal. No scleral icterus.  Neck: Normal range of motion. Neck supple.  Cardiovascular: Normal rate, regular rhythm and normal heart sounds.   Pulmonary/Chest: Effort normal and  breath sounds normal. No respiratory distress.  Abdominal: Soft. He exhibits no distension. There is tenderness. There is guarding.  Vision is exquisitely tender to palpation in the left lower quadrant.  Guarding is present.  He has diffuse tenderness throughout.  Musculoskeletal: He exhibits no edema.  Neurological: He is alert.  Skin: Skin is warm and dry. He is not diaphoretic.  Psychiatric: His behavior is normal.    ED Course  Procedures (including critical care time)  Labs Reviewed  CBC WITH DIFFERENTIAL  COMPREHENSIVE METABOLIC PANEL  LIPASE, BLOOD  URINALYSIS, ROUTINE W REFLEX MICROSCOPIC    No results found.   1. Kidney stone       MDM  11:35 AM Filed Vitals:   09/19/12 1106  BP: 129/84  Pulse: 73  Temp: 96.5 F (35.8 C)  TempSrc: Oral  Resp: 20  SpO2: 97%   Sent here with chief complaint of sudden onset left lower quadrant abdominal pain.  He is exquisitely tender to palpation.  Is concerned for possible acute diverticulitis or other inflammatory process.  Patient has been offered analgesia currently awaiting labs.  Will obtain CT scan of the abdomen and patient's creatinine allows.    12:51 PM Patient states his pain is better. 2 of the CT scans are non functioning and patient is awaiting CT. Temp at triage 96.5 will repeat.    1:30 PM Patient resting comfortably awaiting CT.       2:35 PM Patient with 2mm kidney stone at UVJ. Non obstructing. Will d/c with pain medications. Pt has been diagnosed with a Kidney Stone via CT. There is no evidence of significant hydronephrosis, serum creatine WNL, vitals sign stable and the pt does not have irratractable vomiting. Pt will be dc home with pain medications & has been advised to follow up with PCP.      Arthor Captain, PA-C 09/19/12 1500

## 2012-09-19 NOTE — ED Notes (Signed)
Patient transported to X-ray 

## 2012-11-30 ENCOUNTER — Other Ambulatory Visit: Payer: Medicaid Other

## 2012-12-07 ENCOUNTER — Ambulatory Visit: Payer: Self-pay

## 2012-12-07 ENCOUNTER — Ambulatory Visit (INDEPENDENT_AMBULATORY_CARE_PROVIDER_SITE_OTHER): Payer: Medicaid Other | Admitting: Internal Medicine

## 2012-12-07 ENCOUNTER — Encounter: Payer: Self-pay | Admitting: Internal Medicine

## 2012-12-07 VITALS — BP 133/76 | HR 71 | Temp 97.5°F | Ht 73.0 in | Wt 236.0 lb

## 2012-12-07 DIAGNOSIS — Z113 Encounter for screening for infections with a predominantly sexual mode of transmission: Secondary | ICD-10-CM

## 2012-12-07 DIAGNOSIS — B20 Human immunodeficiency virus [HIV] disease: Secondary | ICD-10-CM

## 2012-12-07 NOTE — Progress Notes (Signed)
  Subjective:    Patient ID: Edward Archer, male    DOB: 03-Oct-1975, 37 y.o.   MRN: [id number]  HPI He comes in for routine followup. He continues to take Complera and reports excellent compliance. He denies any missed doses since his last visit and continues to take it with the appropriate diet. He has had 2 recent visits to the emergency room. He feels well with no diarrhea, no weight loss or new issues. He had been off of his medications about a year ago after losing his Medicaid.   Review of Systems  Constitutional: Negative for fever, fatigue and unexpected weight change.  HENT: Negative for sore throat and trouble swallowing.   Cardiovascular: Negative for leg swelling.  Gastrointestinal: Negative for nausea, abdominal pain and diarrhea.  Musculoskeletal: Negative for myalgias and arthralgias.  Skin: Negative for rash.  Neurological: Negative for dizziness, light-headedness and headaches.  Hematological: Negative for adenopathy.  Psychiatric/Behavioral: Negative for dysphoric mood.       Objective:   Physical Exam  Constitutional: He is oriented to person, place, and time. He appears well-developed and well-nourished. No distress.  HENT:  Mouth/Throat: No oropharyngeal exudate.  Eyes: Right eye exhibits no discharge. Left eye exhibits no discharge. No scleral icterus.  Cardiovascular: Normal rate, regular rhythm and normal heart sounds.   No murmur heard. Pulmonary/Chest: Effort normal and breath sounds normal. No respiratory distress. He has no wheezes.  Lymphadenopathy:    He has no cervical adenopathy.  Neurological: He is alert and oriented to person, place, and time.  Skin: No rash noted.          Assessment & Plan:

## 2012-12-07 NOTE — Assessment & Plan Note (Signed)
He is doing well with his current regimen. I will check his labs today. He will be called if there are any concerns.  He will need his lipid panel next visit and was told to come fasting.

## 2012-12-26 ENCOUNTER — Other Ambulatory Visit: Payer: Self-pay | Admitting: Internal Medicine

## 2012-12-26 DIAGNOSIS — E785 Hyperlipidemia, unspecified: Secondary | ICD-10-CM

## 2013-03-18 ENCOUNTER — Other Ambulatory Visit: Payer: Self-pay

## 2013-03-20 ENCOUNTER — Other Ambulatory Visit: Payer: Self-pay | Admitting: Internal Medicine

## 2013-04-25 ENCOUNTER — Emergency Department (HOSPITAL_COMMUNITY): Payer: Medicaid Other

## 2013-04-25 ENCOUNTER — Emergency Department (HOSPITAL_COMMUNITY)
Admission: EM | Admit: 2013-04-25 | Discharge: 2013-04-25 | Disposition: A | Discharge status: Home / Self Care | Payer: Medicaid Other | Attending: Emergency Medicine | Admitting: Emergency Medicine

## 2013-04-25 ENCOUNTER — Encounter (HOSPITAL_COMMUNITY): Payer: Self-pay | Admitting: Emergency Medicine

## 2013-04-25 DIAGNOSIS — R002 Palpitations: Secondary | ICD-10-CM | POA: Insufficient documentation

## 2013-04-25 DIAGNOSIS — E785 Hyperlipidemia, unspecified: Secondary | ICD-10-CM | POA: Insufficient documentation

## 2013-04-25 DIAGNOSIS — Z79899 Other long term (current) drug therapy: Secondary | ICD-10-CM | POA: Insufficient documentation

## 2013-04-25 DIAGNOSIS — J069 Acute upper respiratory infection, unspecified: Secondary | ICD-10-CM | POA: Insufficient documentation

## 2013-04-25 DIAGNOSIS — Z21 Asymptomatic human immunodeficiency virus [HIV] infection status: Secondary | ICD-10-CM | POA: Insufficient documentation

## 2013-04-25 DIAGNOSIS — J3489 Other specified disorders of nose and nasal sinuses: Secondary | ICD-10-CM | POA: Insufficient documentation

## 2013-04-25 LAB — BASIC METABOLIC PANEL
CO2: 23 mEq/L (ref 19–32)
Chloride: 100 mEq/L (ref 96–112)
GFR calc Af Amer: 78 mL/min — ABNORMAL LOW (ref >90)
Potassium: 4 mEq/L (ref 3.5–5.1)
Sodium: 136 mEq/L (ref 135–145)

## 2013-04-25 LAB — CBC
Hemoglobin: 15 g/dL (ref 13.0–17.0)
Platelets: 193 10*3/uL (ref 150–400)
RDW: 13.5 % (ref 11.5–15.5)
WBC: 7.5 10*3/uL (ref 4.0–10.5)

## 2013-04-25 LAB — POCT I-STAT TROPONIN I: Troponin i, poc: 0.01 ng/mL (ref 0.00–0.08)

## 2013-04-25 MED ORDER — ALBUTEROL SULFATE HFA 108 (90 BASE) MCG/ACT IN AERS
4.0000 | INHALATION_SPRAY | Freq: Once | RESPIRATORY_TRACT | Status: AC
  Administered 2013-04-25: 4 via RESPIRATORY_TRACT
  Filled 2013-04-25: qty 6.7

## 2013-04-25 NOTE — ED Notes (Signed)
MD at bedside. 

## 2013-04-25 NOTE — ED Notes (Signed)
Pt presents with NAD made aware of delay. Calm and cooperative with care. Resting quietly

## 2013-04-25 NOTE — ED Provider Notes (Signed)
CSN: [id number]     Arrival date & time 04/25/13  1548 History   First MD Initiated Contact with Patient 04/25/13 1914     Chief Complaint  Patient presents with  . Chest Pain  . Sore Throat   (Consider location/radiation/quality/duration/timing/severity/associated sxs/prior Treatment) Patient is a 37 y.o. male presenting with URI.  URI Presenting symptoms: congestion and sore throat   Presenting symptoms: no cough and no fever   Severity:  Moderate Onset quality:  Gradual Duration: a few days. Timing:  Constant Progression:  Unchanged Chronicity:  New Relieved by:  Nothing Worsened by:  Nothing tried Associated symptoms: no wheezing   Associated symptoms comment:  Difficulty breathing   Past Medical History  Diagnosis Date  . 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 Hyperlipidemia   . HIV infection    History reviewed. No pertinent past surgical history. No family history on file. History  Substance Use Topics  . Smoking status: Never Smoker   . Smokeless tobacco: Never Used  . Alcohol Use: No    Review of Systems  Constitutional: Negative for fever.  HENT: Positive for congestion and sore throat.   Respiratory: Negative for cough, shortness of breath and wheezing.   Cardiovascular: Negative for chest pain.       Chest fluttering  Gastrointestinal: Negative for nausea, vomiting, abdominal pain and diarrhea.  All other systems reviewed and are negative.    Allergies  Review of patient's allergies indicates no known allergies.  Home Medications   Current Outpatient Rx  Name  Route  Sig  Dispense  Refill  . Emtricitab-Rilpivir-Tenofovir (COMPLERA) 200-25-300 MG TABS   Oral   Take 1 tablet by mouth continuous dialysis.         . Multiple Vitamin (ONE-A-DAY MENS PO)   Oral   Take 1 tablet by mouth daily.         . pravastatin (PRAVACHOL) 20 MG tablet   Oral   Take 20 mg by mouth daily.          BP 122/78  Pulse 74   Temp(Src) 98 F (36.7 C) (Oral)  Resp 16  SpO2 100% Physical Exam  Nursing note and vitals reviewed. Constitutional: He is oriented to person, place, and time. He appears well-developed and well-nourished. No distress.  HENT:  Head: Normocephalic and atraumatic.  Mouth/Throat: Oropharynx is clear and moist. No trismus in the jaw. No uvula swelling. No posterior oropharyngeal edema or tonsillar abscesses.  Eyes: Conjunctivae are normal. Pupils are equal, round, and reactive to light. No scleral icterus.  Neck: Neck supple.  Cardiovascular: Normal rate, regular rhythm, normal heart sounds and intact distal pulses.   No murmur heard. Pulmonary/Chest: Effort normal and breath sounds normal. No stridor. No respiratory distress. He has no wheezes. He has no rales.  Abdominal: Soft. He exhibits no distension. There is no tenderness.  Musculoskeletal: Normal range of motion. He exhibits no edema.  Neurological: He is alert and oriented to person, place, and time.  Skin: Skin is warm and dry. No rash noted.  Psychiatric: He has a normal mood and affect. His behavior is normal.    ED Course  Procedures (including critical care time) Labs Review Labs Reviewed  BASIC METABOLIC PANEL - Abnormal; Notable for the following:    GFR calc non Af Amer 68 (*)    GFR calc Af Amer 78 (*)    All other components within normal limits  CBC  POCT I-STAT TROPONIN  I   Imaging Review Dg Chest 2 View  04/25/2013   CLINICAL DATA:  Chest pain.  Shortness of breath.  Sore throat.  EXAM: CHEST  2 VIEW  COMPARISON:  06/06/2012  FINDINGS: The heart size and mediastinal contours are within normal limits. Both lungs are clear. The visualized skeletal structures are unremarkable.  IMPRESSION: No active cardiopulmonary disease.   Electronically Signed   By: Myles Rosenthal M.D.   On: 04/25/2013 20:55  All radiology studies independently viewed by me.     EKG Interpretation    Date/Time:  Sunday April 25 2013  15:52:16 EST Ventricular Rate:  74 PR Interval:  168 QRS Duration: 82 QT Interval:  375 QTC Calculation: 416 R Axis:   7 Text Interpretation:  Sinus rhythm No significant change was found Confirmed by [facility]  MD, TREY ([id number]) on 04/25/2013 7:44:44 PM            MDM   1. Viral URI    37 yo male with hx of HIV on HAART with reported compliance and good counts presenting with URI symptoms . He complains most of congestion, mucus, and difficulty breathing, especially after laying down or sleeping.  This sounds consistent with a postnasal drip which resolves after clearing mucus from his throat.  He is well appearing, in no resp distress, and has clear lungs, although with mild decreased air movement.  He also reported some chest twitching.  I felt a muscle twitching in his upper right chest briefly during my exam, which pt confirms is what he was feeling.    Pt reported feeling better after albuterol.  He had better air movement.  CXR was negative.  He remained well appearing.  Felt to be stable for outpatient management.    Candyce Churn, MD 04/25/13 [id number]

## 2013-04-25 NOTE — ED Notes (Signed)
Pt states that he has also been coughing up white phlegm.

## 2013-04-25 NOTE — ED Notes (Signed)
Pt states that last night started having sore throat then today around 04-1229 he started having pain in right side of chest "flutter like" and happened again here in triage on left side per pt. Pt stated that he got shob earlier when the pain happened.

## 2013-05-27 ENCOUNTER — Other Ambulatory Visit (HOSPITAL_COMMUNITY)
Admission: RE | Admit: 2013-05-27 | Discharge: 2013-05-27 | Disposition: A | Discharge status: Home / Self Care | Payer: Medicaid Other | Source: Ambulatory Visit | Attending: Internal Medicine | Admitting: Internal Medicine

## 2013-05-27 ENCOUNTER — Other Ambulatory Visit (INDEPENDENT_AMBULATORY_CARE_PROVIDER_SITE_OTHER): Payer: Medicaid Other

## 2013-05-27 DIAGNOSIS — Z113 Encounter for screening for infections with a predominantly sexual mode of transmission: Secondary | ICD-10-CM | POA: Insufficient documentation

## 2013-05-27 DIAGNOSIS — B20 Human immunodeficiency virus [HIV] disease: Secondary | ICD-10-CM

## 2013-05-27 LAB — COMPLETE METABOLIC PANEL WITH GFR
ALT: 36 U/L (ref 0–53)
AST: 26 U/L (ref 0–37)
Albumin: 4 g/dL (ref 3.5–5.2)
Alkaline Phosphatase: 59 U/L (ref 39–117)
BILIRUBIN TOTAL: 0.5 mg/dL (ref 0.3–1.2)
BUN: 16 mg/dL (ref 6–23)
CALCIUM: 8.7 mg/dL (ref 8.4–10.5)
CHLORIDE: 106 meq/L (ref 96–112)
CO2: 26 mEq/L (ref 19–32)
CREATININE: 1.33 mg/dL (ref 0.50–1.35)
GFR, EST NON AFRICAN AMERICAN: 67 mL/min
GFR, Est African American: 77 mL/min
Glucose, Bld: 105 mg/dL — ABNORMAL HIGH (ref 70–99)
Potassium: 4.1 mEq/L (ref 3.5–5.3)
SODIUM: 139 meq/L (ref 135–145)
TOTAL PROTEIN: 6.7 g/dL (ref 6.0–8.3)

## 2013-05-27 LAB — LIPID PANEL
Cholesterol: 158 mg/dL (ref 0–200)
HDL: 42 mg/dL (ref >39)
LDL CALC: 104 mg/dL — AB (ref 0–99)
Total CHOL/HDL Ratio: 3.8 Ratio
Triglycerides: 60 mg/dL (ref <150)
VLDL: 12 mg/dL (ref 0–40)

## 2013-05-27 LAB — CBC WITH DIFFERENTIAL/PLATELET
Basophils Absolute: 0 10*3/uL (ref 0.0–0.1)
Basophils Relative: 0 % (ref 0–1)
EOS ABS: 0.1 10*3/uL (ref 0.0–0.7)
EOS PCT: 1 % (ref 0–5)
HCT: 45.7 % (ref 39.0–52.0)
HEMOGLOBIN: 15.6 g/dL (ref 13.0–17.0)
Lymphocytes Relative: 43 % (ref 12–46)
Lymphs Abs: 2.1 10*3/uL (ref 0.7–4.0)
MCH: 29.5 pg (ref 26.0–34.0)
MCHC: 34.1 g/dL (ref 30.0–36.0)
MCV: 86.4 fL (ref 78.0–100.0)
MONOS PCT: 9 % (ref 3–12)
Monocytes Absolute: 0.4 10*3/uL (ref 0.1–1.0)
Neutro Abs: 2.3 10*3/uL (ref 1.7–7.7)
Neutrophils Relative %: 47 % (ref 43–77)
PLATELETS: 181 10*3/uL (ref 150–400)
RBC: 5.29 MIL/uL (ref 4.22–5.81)
RDW: 14.5 % (ref 11.5–15.5)
WBC: 4.9 10*3/uL (ref 4.0–10.5)

## 2013-05-27 LAB — RPR

## 2013-05-30 LAB — HIV-1 RNA QUANT-NO REFLEX-BLD
HIV 1 RNA Quant: <20 copies/mL (ref <20)
HIV-1 RNA Quant, Log: <1.3 {Log} (ref <1.30)

## 2013-05-31 LAB — T-HELPER CELL (CD4) - (RCID CLINIC ONLY)
CD4 T CELL ABS: 540 /uL (ref 400–2700)
CD4 T CELL HELPER: 27 % — AB (ref 33–55)

## 2013-06-10 ENCOUNTER — Encounter: Payer: Self-pay | Admitting: Internal Medicine

## 2013-06-10 ENCOUNTER — Ambulatory Visit (INDEPENDENT_AMBULATORY_CARE_PROVIDER_SITE_OTHER): Payer: Self-pay | Admitting: Internal Medicine

## 2013-06-10 ENCOUNTER — Other Ambulatory Visit: Payer: Self-pay | Admitting: *Deleted

## 2013-06-10 ENCOUNTER — Ambulatory Visit: Payer: Self-pay

## 2013-06-10 VITALS — BP 129/93 | HR 74 | Temp 98.1°F | Ht 73.0 in | Wt 234.0 lb

## 2013-06-10 DIAGNOSIS — R7309 Other abnormal glucose: Secondary | ICD-10-CM

## 2013-06-10 DIAGNOSIS — R739 Hyperglycemia, unspecified: Secondary | ICD-10-CM | POA: Insufficient documentation

## 2013-06-10 DIAGNOSIS — B20 Human immunodeficiency virus [HIV] disease: Secondary | ICD-10-CM

## 2013-06-10 DIAGNOSIS — Z23 Encounter for immunization: Secondary | ICD-10-CM

## 2013-06-10 DIAGNOSIS — E7800 Pure hypercholesterolemia, unspecified: Secondary | ICD-10-CM

## 2013-06-10 MED ORDER — EMTRICITAB-RILPIVIR-TENOFOV DF 200-25-300 MG PO TABS: 1.0000 | ORAL_TABLET | ORAL | Status: DC

## 2013-06-10 NOTE — Assessment & Plan Note (Signed)
Good control on Pravastatin.  No changes indicated.  LDL good.

## 2013-06-10 NOTE — Progress Notes (Signed)
  Subjective:    Patient ID: Edward LeuBobby Archer, male    DOB: 01/28/1976, 38 y.o.   MRN: [id number]  HPI  He comes in for routine followup. He continues to take Complera and reports excellent compliance. He denies any missed doses since his last visit and continues to take it with the appropriate diet. He feels well with no diarrhea, no weight loss or new issues. He has been eating out more often and some weight gain.  Glucose is mildly elevated.  He continues on pravastatin.     Review of Systems  Constitutional: Negative for fever, fatigue and unexpected weight change.  HENT: Negative for sore throat and trouble swallowing.   Cardiovascular: Negative for leg swelling.  Gastrointestinal: Negative for nausea, abdominal pain and diarrhea.  Musculoskeletal: Negative for arthralgias and myalgias.  Skin: Negative for rash.  Neurological: Negative for dizziness, light-headedness and headaches.  Hematological: Negative for adenopathy.  Psychiatric/Behavioral: Negative for dysphoric mood.       Objective:   Physical Exam  Constitutional: He is oriented to person, place, and time. He appears well-developed and well-nourished. No distress.  HENT:  Mouth/Throat: No oropharyngeal exudate.  Eyes: Right eye exhibits no discharge. Left eye exhibits no discharge. No scleral icterus.  Cardiovascular: Normal rate, regular rhythm and normal heart sounds.   No murmur heard. Pulmonary/Chest: Effort normal and breath sounds normal. No respiratory distress. He has no wheezes.  Lymphadenopathy:    He has no cervical adenopathy.  Neurological: He is alert and oriented to person, place, and time.  Skin: No rash noted.          Assessment & Plan:

## 2013-06-10 NOTE — Assessment & Plan Note (Signed)
Mild elevation.  I discussed diet modifications, exercise to avoid future diabetes risk.  Patient motivated to improve.

## 2013-06-10 NOTE — Assessment & Plan Note (Signed)
Great control.  RTC 6 months.

## 2013-07-09 ENCOUNTER — Other Ambulatory Visit: Payer: Self-pay | Admitting: *Deleted

## 2013-07-09 DIAGNOSIS — B20 Human immunodeficiency virus [HIV] disease: Secondary | ICD-10-CM

## 2013-07-09 MED ORDER — EMTRICITAB-RILPIVIR-TENOFOV DF 200-25-300 MG PO TABS: 1.0000 | ORAL_TABLET | Freq: Every day | ORAL | Status: DC

## 2013-11-03 ENCOUNTER — Other Ambulatory Visit: Payer: Self-pay | Admitting: Internal Medicine

## 2013-11-30 ENCOUNTER — Other Ambulatory Visit: Payer: Self-pay

## 2013-12-11 ENCOUNTER — Other Ambulatory Visit: Payer: Self-pay | Admitting: Internal Medicine

## 2013-12-11 DIAGNOSIS — E7800 Pure hypercholesterolemia, unspecified: Secondary | ICD-10-CM

## 2013-12-14 ENCOUNTER — Ambulatory Visit: Payer: Self-pay | Admitting: Internal Medicine

## 2013-12-30 ENCOUNTER — Encounter: Payer: Self-pay | Admitting: Internal Medicine

## 2013-12-30 ENCOUNTER — Ambulatory Visit (INDEPENDENT_AMBULATORY_CARE_PROVIDER_SITE_OTHER): Payer: Self-pay | Admitting: Internal Medicine

## 2013-12-30 VITALS — BP 129/89 | HR 76 | Temp 97.5°F | Wt 239.0 lb

## 2013-12-30 DIAGNOSIS — Z23 Encounter for immunization: Secondary | ICD-10-CM

## 2013-12-30 DIAGNOSIS — J302 Other seasonal allergic rhinitis: Secondary | ICD-10-CM

## 2013-12-30 DIAGNOSIS — B20 Human immunodeficiency virus [HIV] disease: Secondary | ICD-10-CM

## 2013-12-30 DIAGNOSIS — J309 Allergic rhinitis, unspecified: Secondary | ICD-10-CM

## 2013-12-30 DIAGNOSIS — E7800 Pure hypercholesterolemia, unspecified: Secondary | ICD-10-CM

## 2013-12-30 NOTE — Assessment & Plan Note (Signed)
Doing well.  Labs today and rtc in 4 months if ok.

## 2013-12-30 NOTE — Assessment & Plan Note (Signed)
Will try claritin

## 2013-12-30 NOTE — Assessment & Plan Note (Signed)
Discussed diet and exercise 

## 2013-12-30 NOTE — Progress Notes (Signed)
  Subjective:    Patient ID: Edward LeuBobby Archer, male    DOB: 11/29/1975, 38 y.o.   MRN: [id number]  HPI  He comes in for routine followup. He continues to take Complera and reports excellent compliance, only 1 or two missed doses.  He feels well with no diarrhea, no weight loss or new issues.  More weight gain.   He continues on pravastatin.  Some left ear discomfort.    Review of Systems  Constitutional: Negative for fever, fatigue and unexpected weight change.  HENT: Negative for sore throat and trouble swallowing.   Cardiovascular: Negative for leg swelling.  Gastrointestinal: Negative for nausea, abdominal pain and diarrhea.  Musculoskeletal: Negative for arthralgias and myalgias.  Skin: Negative for rash.  Neurological: Negative for dizziness, light-headedness and headaches.  Hematological: Negative for adenopathy.  Psychiatric/Behavioral: Negative for dysphoric mood.       Objective:   Physical Exam  Constitutional: He is oriented to person, place, and time. He appears well-developed and well-nourished. No distress.  HENT:  Mouth/Throat: No oropharyngeal exudate.  Left ear with fluid  Eyes: Right eye exhibits no discharge. Left eye exhibits no discharge. No scleral icterus.  Cardiovascular: Normal rate, regular rhythm and normal heart sounds.   No murmur heard. Pulmonary/Chest: Effort normal and breath sounds normal. No respiratory distress. He has no wheezes.  Lymphadenopathy:    He has no cervical adenopathy.  Neurological: He is alert and oriented to person, place, and time.  Skin: No rash noted.          Assessment & Plan:

## 2013-12-31 LAB — HIV-1 RNA QUANT-NO REFLEX-BLD

## 2013-12-31 LAB — T-HELPER CELL (CD4) - (RCID CLINIC ONLY)
CD4 % Helper T Cell: 35 % (ref 33–55)
CD4 T Cell Abs: 720 /uL (ref 400–2700)

## 2014-01-03 ENCOUNTER — Other Ambulatory Visit: Payer: Self-pay | Admitting: *Deleted

## 2014-01-03 DIAGNOSIS — B20 Human immunodeficiency virus [HIV] disease: Secondary | ICD-10-CM

## 2014-01-03 MED ORDER — EMTRICITAB-RILPIVIR-TENOFOV DF 200-25-300 MG PO TABS: 1.0000 | ORAL_TABLET | Freq: Every day | ORAL | Status: DC

## 2014-01-03 NOTE — Telephone Encounter (Signed)
ADAP Application 

## 2014-04-27 ENCOUNTER — Encounter (HOSPITAL_COMMUNITY): Payer: Self-pay | Admitting: Emergency Medicine

## 2014-04-27 ENCOUNTER — Emergency Department (HOSPITAL_COMMUNITY): Payer: Self-pay

## 2014-04-27 ENCOUNTER — Emergency Department (HOSPITAL_COMMUNITY)
Admission: EM | Admit: 2014-04-27 | Discharge: 2014-04-27 | Disposition: A | Discharge status: Home / Self Care | Payer: Self-pay | Attending: Emergency Medicine | Admitting: Emergency Medicine

## 2014-04-27 DIAGNOSIS — Y998 Other external cause status: Secondary | ICD-10-CM | POA: Insufficient documentation

## 2014-04-27 DIAGNOSIS — Y9289 Other specified places as the place of occurrence of the external cause: Secondary | ICD-10-CM | POA: Insufficient documentation

## 2014-04-27 DIAGNOSIS — Z79899 Other long term (current) drug therapy: Secondary | ICD-10-CM | POA: Insufficient documentation

## 2014-04-27 DIAGNOSIS — Y9389 Activity, other specified: Secondary | ICD-10-CM | POA: Insufficient documentation

## 2014-04-27 DIAGNOSIS — W540XXA Bitten by dog, initial encounter: Secondary | ICD-10-CM | POA: Insufficient documentation

## 2014-04-27 DIAGNOSIS — S81852A Open bite, left lower leg, initial encounter: Secondary | ICD-10-CM | POA: Insufficient documentation

## 2014-04-27 DIAGNOSIS — S61452A Open bite of left hand, initial encounter: Secondary | ICD-10-CM | POA: Insufficient documentation

## 2014-04-27 DIAGNOSIS — Z21 Asymptomatic human immunodeficiency virus [HIV] infection status: Secondary | ICD-10-CM | POA: Insufficient documentation

## 2014-04-27 DIAGNOSIS — Z23 Encounter for immunization: Secondary | ICD-10-CM | POA: Insufficient documentation

## 2014-04-27 DIAGNOSIS — E785 Hyperlipidemia, unspecified: Secondary | ICD-10-CM | POA: Insufficient documentation

## 2014-04-27 MED ORDER — HYDROMORPHONE HCL 1 MG/ML IJ SOLN
1.0000 mg | Freq: Once | INTRAMUSCULAR | Status: AC
  Administered 2014-04-27: 1 mg via INTRAVENOUS
  Filled 2014-04-27: qty 1

## 2014-04-27 MED ORDER — AMOXICILLIN-POT CLAVULANATE 875-125 MG PO TABS: 1.0000 | ORAL_TABLET | Freq: Two times a day (BID) | ORAL | Status: DC

## 2014-04-27 MED ORDER — HYDROCODONE-ACETAMINOPHEN 5-325 MG PO TABS
1.0000 | ORAL_TABLET | Freq: Once | ORAL | Status: AC
  Administered 2014-04-27: 1 via ORAL
  Filled 2014-04-27: qty 1

## 2014-04-27 MED ORDER — TETANUS-DIPHTH-ACELL PERTUSSIS 5-2.5-18.5 LF-MCG/0.5 IM SUSP
0.5000 mL | Freq: Once | INTRAMUSCULAR | Status: AC
  Administered 2014-04-27: 0.5 mL via INTRAMUSCULAR
  Filled 2014-04-27: qty 0.5

## 2014-04-27 MED ORDER — HYDROCODONE-ACETAMINOPHEN 5-325 MG PO TABS: 1.0000 | ORAL_TABLET | ORAL | Status: DC | PRN

## 2014-04-27 MED ORDER — LIDOCAINE HCL (PF) 1 % IJ SOLN
5.0000 mL | Freq: Once | INTRAMUSCULAR | Status: AC
  Administered 2014-04-27: 5 mL via INTRADERMAL
  Filled 2014-04-27: qty 5

## 2014-04-27 MED ORDER — ONDANSETRON HCL 4 MG/2ML IJ SOLN
4.0000 mg | Freq: Once | INTRAMUSCULAR | Status: AC
  Administered 2014-04-27: 4 mg via INTRAVENOUS
  Filled 2014-04-27: qty 2

## 2014-04-27 MED ORDER — AMOXICILLIN-POT CLAVULANATE 875-125 MG PO TABS
1.0000 | ORAL_TABLET | Freq: Once | ORAL | Status: AC
  Administered 2014-04-27: 1 via ORAL
  Filled 2014-04-27: qty 1

## 2014-04-27 NOTE — Discharge Instructions (Signed)
Please follow the directions provided.  Be sure to follow-up with Dr. Mina MarbleWeingold regarding the bite to your hand.  Please follow-up with your primary care provider at [facility] regarding the wound on your leg.  The wounds should be kept clean and dry and a dressing kept in place until they heal. Please take your antibiotic as directed until it is all gone.  You may take the pain medicine as needed.   Don't hesitate to return for any new, worsening or concerning symptoms.    SEEK MEDICAL CARE IF:  You notice warmth, redness, soreness, swelling, pus discharge, or a bad smell coming from the wound.  You have a red line on the skin coming from the wound.  You have a fever, chills, or a general ill feeling.  You have nausea or vomiting.  You have continued or worsening pain.  You have trouble moving the injured part.  You have other questions or concerns.

## 2014-04-27 NOTE — ED Provider Notes (Signed)
CSN: [id number]     Arrival date & time 04/27/14  1314 History   First MD Initiated Contact with Patient 04/27/14 1325     Chief Complaint  Patient presents with  . Animal Bite    (Consider location/radiation/quality/duration/timing/severity/associated sxs/prior Treatment) HPI Edward Archer is a 38 yo male presenting with report of dog bite by neighbor's dog.  He has a history of HIV that is managed at [facility] and he reports his viral load is currently undetectable.  He reports this happened appr 20 min PTA.  He was bit on the left hand and left calf.  He is unsure if his tetanus vaccine is up to date.  He describes the pain as sharp and rates it as 10/10.  The neighbor reports the dog is up to date with all vaccinations.   He denies any loss of sensation or weakness in either extremity.    Past Medical History  Diagnosis Date  . 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 Hyperlipidemia   . HIV infection    History reviewed. No pertinent past surgical history. History reviewed. No pertinent family history. History  Substance Use Topics  . Smoking status: Never Smoker   . Smokeless tobacco: Never Used  . Alcohol Use: No    Review of Systems  Constitutional: Negative for fever and chills.  Respiratory: Negative for shortness of breath.   Cardiovascular: Negative for chest pain and leg swelling.  Gastrointestinal: Negative for nausea and vomiting.  Genitourinary: Negative for dysuria.  Musculoskeletal: Positive for myalgias.  Skin: Positive for wound. Negative for rash.  Neurological: Negative for weakness, numbness and headaches.    Allergies  Review of patient's allergies indicates no known allergies.  Home Medications   Prior to Admission medications   Medication Sig Start Date End Date Taking? Authorizing Provider  Emtricitab-Rilpivir-Tenofovir 200-25-300 MG TABS Take 1 tablet by mouth daily. 01/03/14   Cliffton AstersJohn Campbell, MD  Multiple Vitamin  (ONE-A-DAY MENS PO) Take 1 tablet by mouth daily.    Historical Provider, MD  pravastatin (PRAVACHOL) 20 MG tablet TAKE 1 TABLET BY MOUTH EVERY MORNING    Gardiner Barefootobert W Comer, MD   BP 136/100 mmHg  Pulse 62  Temp(Src) 97.4 F (36.3 C) (Oral)  Resp 24  Ht 6\' 1"  (1.854 m)  Wt 230 lb (104.327 kg)  BMI 30.35 kg/m2  SpO2 100% Physical Exam  Constitutional: He appears well-developed and well-nourished. No distress.  HENT:  Head: Normocephalic and atraumatic.  Mouth/Throat: Oropharynx is clear and moist. No oropharyngeal exudate.  Eyes: Conjunctivae are normal.  Neck: Neck supple. No thyromegaly present.  Cardiovascular: Normal rate, regular rhythm and intact distal pulses.   Pulmonary/Chest: Effort normal and breath sounds normal. No respiratory distress. He has no wheezes. He has no rales. He exhibits no tenderness.  Abdominal: Soft. There is no tenderness.  Musculoskeletal: He exhibits no tenderness.  Superficial laceration to dorsal aspect of left hand.  2.5 cm laceration to lateral, palmar aspect of left hand just inferior to small finger.  2 cm avulsed wound to left posterior calf superior to a 1 cm avulsed wound to posterior calf.   Lymphadenopathy:    He has no cervical adenopathy.  Neurological: He is alert.  Skin: Skin is warm and dry. No rash noted. He is not diaphoretic.  Psychiatric: He has a normal mood and affect.  Nursing note and vitals reviewed.  Left hand, dorsal aspect     Left hand,  palmar aspect     Left posterior calf    ED Course  Procedures (including critical care time)  LACERATION REPAIR Performed by: Harle Battiest Authorized by: Harle Battiest Consent: Verbal consent obtained. Risks and benefits: risks, benefits and alternatives were discussed Consent given by: patient Patient identity confirmed: provided demographic data Prepped and Draped in normal sterile fashion Wound explored  Laceration Location: Left hand  Laceration  Length: 2.5 cm  No Foreign Bodies seen or palpated  Anesthesia: local infiltration  Local anesthetic: lidocaine 1% without epinephrine  Anesthetic total: 2 ml  Irrigation method: syringe Amount of cleaning: standard  Skin closure: 4-0 prolene  Number of sutures: 1  Technique: Loose simple interrupted  Patient tolerance: Patient tolerated the procedure well with no immediate complications.   Labs Review Labs Reviewed - No data to display  Imaging Review Dg Tibia/fibula Left  04/27/2014   CLINICAL DATA:  Multiple dog bites.  EXAM: LEFT TIBIA AND FIBULA - 2 VIEW  COMPARISON:  None.  FINDINGS: No acute osseous abnormality.  No radiopaque foreign body.  IMPRESSION: No acute osseous abnormality.  No radiopaque foreign body.   Electronically Signed   By: Leanna Battles M.D.   On: 04/27/2014 14:56   Dg Hand Complete Left  04/27/2014   CLINICAL DATA:  38 year old male with multiple dog bites to the left hand. Initial encounter.  EXAM: LEFT HAND - COMPLETE 3+ VIEW  COMPARISON:  None.  FINDINGS: Bone mineralization is within normal limits. Distal left radius and ulna intact. Carpal bone alignment preserved. Metacarpals intact. Joint spaces and alignment preserved. No acute fracture or dislocation identified. Suggestion of subcutaneous gas situated between the third and fourth, and fourth and fifth MCP joints (arrows). No radiopaque foreign body identified.  IMPRESSION: Subcutaneous gas situated around the third through fifth MCP joints. No acute fracture or dislocation identified about the left hand.   Electronically Signed   By: Augusto Gamble M.D.   On: 04/27/2014 14:57     EKG Interpretation None      MDM   Final diagnoses:  Dog bite   89 yo with multiple lacerations on left hand and left calf from dog attack. Case discussed with Dr. Jodi Mourning.  Dog is known and belongs to the neighbor and is current with all shots.  Thorough wound irrigation performed.  X-rays negative for foreign body  or fracture. Tdap and augmentin given and pain managed in the ED.  Local infiltration performed of lacerations.  Pt has motor, strength and vascular intact distal to injuries.  Discussed hand wound management with Dr. Mina Marble.  Recommended only minimal repair of hand with outpt follow-up.  One loose suture placed to largest hand wound.  All wounds dressed with referral to Dr. Mina Marble for follow-up of hand wound and PCP follow-up to re-check leg wound.  Discussed with pt the directions for prescription of Augmentin and follow-up.  Pt is well-appearing, in no acute distress and vital signs are stable.  He appears safe to be discharged. Return precautions provided.  Pt aware of plan and in agreement.      Filed Vitals:   04/27/14 1530 04/27/14 1545 04/27/14 1600 04/27/14 1630  BP: 131/85 122/78 133/89 138/89  Pulse: 77 89 74 77  Temp:      TempSrc:      Resp:   20   Height:      Weight:      SpO2: 100% 98% 100% 97%   Meds given in ED:  Medications  HYDROmorphone (DILAUDID) injection 1 mg (1 mg Intravenous Given 04/27/14 1359)  ondansetron (ZOFRAN) injection 4 mg (4 mg Intravenous Given 04/27/14 1359)  lidocaine (PF) (XYLOCAINE) 1 % injection 5 mL (5 mLs Intradermal Given 04/27/14 1502)  Tdap (BOOSTRIX) injection 0.5 mL (0.5 mLs Intramuscular Given 04/27/14 1501)  amoxicillin-clavulanate (AUGMENTIN) 875-125 MG per tablet 1 tablet (1 tablet Oral Given 04/27/14 1631)  HYDROcodone-acetaminophen (NORCO/VICODIN) 5-325 MG per tablet 1 tablet (1 tablet Oral Given 04/27/14 1706)    Discharge Medication List as of 04/27/2014  4:36 PM    START taking these medications   Details  amoxicillin-clavulanate (AUGMENTIN) 875-125 MG per tablet Take 1 tablet by mouth every 12 (twelve) hours., Starting 04/27/2014, Until Discontinued, Print    HYDROcodone-acetaminophen (NORCO/VICODIN) 5-325 MG per tablet Take 1-2 tablets by mouth every 4 (four) hours as needed for moderate pain or severe pain., Starting  04/27/2014, Until Discontinued, Print           Harle BattiestElizabeth Milka Windholz, NP 04/27/14 2014  Enid SkeensJoshua M Zavitz, MD 04/29/14 [phone]

## 2014-04-27 NOTE — ED Notes (Signed)
Patient transported to X-ray 

## 2014-04-27 NOTE — ED Notes (Signed)
Pt here with dog bite to left hand and left calf area; moderate amount of blood noted to pt boot and on pants; pt appears pale and diaphoretic; pt with multiple puncture wounds and laceration to head

## 2014-05-02 ENCOUNTER — Other Ambulatory Visit: Payer: Self-pay

## 2014-05-02 DIAGNOSIS — B20 Human immunodeficiency virus [HIV] disease: Secondary | ICD-10-CM

## 2014-05-02 LAB — CBC WITH DIFFERENTIAL/PLATELET
Basophils Absolute: 0 10*3/uL (ref 0.0–0.1)
Basophils Relative: 0 % (ref 0–1)
EOS ABS: 0.2 10*3/uL (ref 0.0–0.7)
Eosinophils Relative: 3 % (ref 0–5)
HEMATOCRIT: 41 % (ref 39.0–52.0)
Hemoglobin: 13.6 g/dL (ref 13.0–17.0)
Lymphocytes Relative: 34 % (ref 12–46)
Lymphs Abs: 1.7 10*3/uL (ref 0.7–4.0)
MCH: 29.1 pg (ref 26.0–34.0)
MCHC: 33.2 g/dL (ref 30.0–36.0)
MCV: 87.8 fL (ref 78.0–100.0)
MPV: 10.6 fL (ref 9.4–12.4)
Monocytes Absolute: 0.4 10*3/uL (ref 0.1–1.0)
Monocytes Relative: 8 % (ref 3–12)
NEUTROS ABS: 2.8 10*3/uL (ref 1.7–7.7)
Neutrophils Relative %: 55 % (ref 43–77)
PLATELETS: 190 10*3/uL (ref 150–400)
RBC: 4.67 MIL/uL (ref 4.22–5.81)
RDW: 14 % (ref 11.5–15.5)
WBC: 5.1 10*3/uL (ref 4.0–10.5)

## 2014-05-02 LAB — COMPLETE METABOLIC PANEL WITH GFR
ALK PHOS: 53 U/L (ref 39–117)
ALT: 29 U/L (ref 0–53)
AST: 26 U/L (ref 0–37)
Albumin: 3.8 g/dL (ref 3.5–5.2)
BUN: 14 mg/dL (ref 6–23)
CALCIUM: 8.4 mg/dL (ref 8.4–10.5)
CO2: 24 mEq/L (ref 19–32)
Chloride: 108 mEq/L (ref 96–112)
Creat: 1.16 mg/dL (ref 0.50–1.35)
GFR, EST NON AFRICAN AMERICAN: 79 mL/min
GFR, Est African American: >89 mL/min
Glucose, Bld: 106 mg/dL — ABNORMAL HIGH (ref 70–99)
Potassium: 4.1 mEq/L (ref 3.5–5.3)
Sodium: 141 mEq/L (ref 135–145)
Total Bilirubin: 0.4 mg/dL (ref 0.2–1.2)
Total Protein: 6.2 g/dL (ref 6.0–8.3)

## 2014-05-02 LAB — LIPID PANEL
CHOL/HDL RATIO: 5 ratio
Cholesterol: 164 mg/dL (ref 0–200)
HDL: 33 mg/dL — AB (ref >39)
LDL CALC: 115 mg/dL — AB (ref 0–99)
Triglycerides: 82 mg/dL (ref <150)
VLDL: 16 mg/dL (ref 0–40)

## 2014-05-02 LAB — RPR

## 2014-05-02 NOTE — Addendum Note (Signed)
Addended by: Mariea ClontsGREEN, Mccall Will D on: 05/02/2014 04:01 PM   Modules accepted: Orders

## 2014-05-03 LAB — URINE CYTOLOGY ANCILLARY ONLY
Chlamydia: NEGATIVE
Neisseria Gonorrhea: NEGATIVE

## 2014-05-03 LAB — HIV-1 RNA QUANT-NO REFLEX-BLD: HIV-1 RNA Quant, Log: <1.3 {Log} (ref <1.30)

## 2014-05-03 LAB — T-HELPER CELL (CD4) - (RCID CLINIC ONLY)
CD4 % Helper T Cell: 32 % — ABNORMAL LOW (ref 33–55)
CD4 T CELL ABS: 570 /uL (ref 400–2700)

## 2014-05-26 ENCOUNTER — Ambulatory Visit (INDEPENDENT_AMBULATORY_CARE_PROVIDER_SITE_OTHER): Payer: Self-pay | Admitting: Internal Medicine

## 2014-05-26 ENCOUNTER — Encounter: Payer: Self-pay | Admitting: Internal Medicine

## 2014-05-26 VITALS — BP 147/86 | HR 84 | Temp 97.7°F | Wt 237.0 lb

## 2014-05-26 DIAGNOSIS — E7800 Pure hypercholesterolemia, unspecified: Secondary | ICD-10-CM

## 2014-05-26 DIAGNOSIS — I10 Essential (primary) hypertension: Secondary | ICD-10-CM | POA: Insufficient documentation

## 2014-05-26 DIAGNOSIS — B20 Human immunodeficiency virus [HIV] disease: Secondary | ICD-10-CM

## 2014-05-26 NOTE — Progress Notes (Signed)
  Subjective:    Patient ID: Edward Archer, male    DOB: 09/02/1975, 39 y.o.   MRN: [id number]  HPI He comes in for routine followup. He continues to take Complera and reports excellent compliance, only 1 or two missed doses.  He feels well with no diarrhea, no weight loss or new issues.  More weight gain.   He continues on pravastatin.  Some left ear discomfort.  LDL 115, down from 161 initially. CD4 570, viral load remains undetectable.     Review of Systems  Constitutional: Negative for fever, fatigue and unexpected weight change.  HENT: Negative for sore throat and trouble swallowing.   Cardiovascular: Negative for leg swelling.  Gastrointestinal: Negative for nausea, abdominal pain and diarrhea.  Musculoskeletal: Negative for myalgias and arthralgias.  Skin: Negative for rash.  Neurological: Negative for dizziness, light-headedness and headaches.  Hematological: Negative for adenopathy.  Psychiatric/Behavioral: Negative for dysphoric mood.       Objective:   Physical Exam  Constitutional: He appears well-developed and well-nourished. No distress.  HENT:  Mouth/Throat: No oropharyngeal exudate.  Eyes: No scleral icterus.  Cardiovascular: Normal rate, regular rhythm and normal heart sounds.   No murmur heard. Pulmonary/Chest: Effort normal and breath sounds normal. No respiratory distress. He has no wheezes.  Lymphadenopathy:    He has no cervical adenopathy.  Skin: No rash noted.          Assessment & Plan:

## 2014-07-02 ENCOUNTER — Other Ambulatory Visit: Payer: Self-pay | Admitting: Internal Medicine

## 2014-07-02 DIAGNOSIS — B20 Human immunodeficiency virus [HIV] disease: Secondary | ICD-10-CM

## 2014-11-29 ENCOUNTER — Other Ambulatory Visit: Payer: Self-pay

## 2014-12-13 ENCOUNTER — Ambulatory Visit: Payer: Self-pay | Admitting: Internal Medicine

## 2015-09-16 IMAGING — CR DG CHEST 2V
2 series · 2 of 2 positions shown · non-contrast
Comparison: 06/06/2012

CLINICAL DATA: Chest pain.  Shortness of breath.  Sore throat.

EXAM:
CHEST  2 VIEW

[w chest pa]
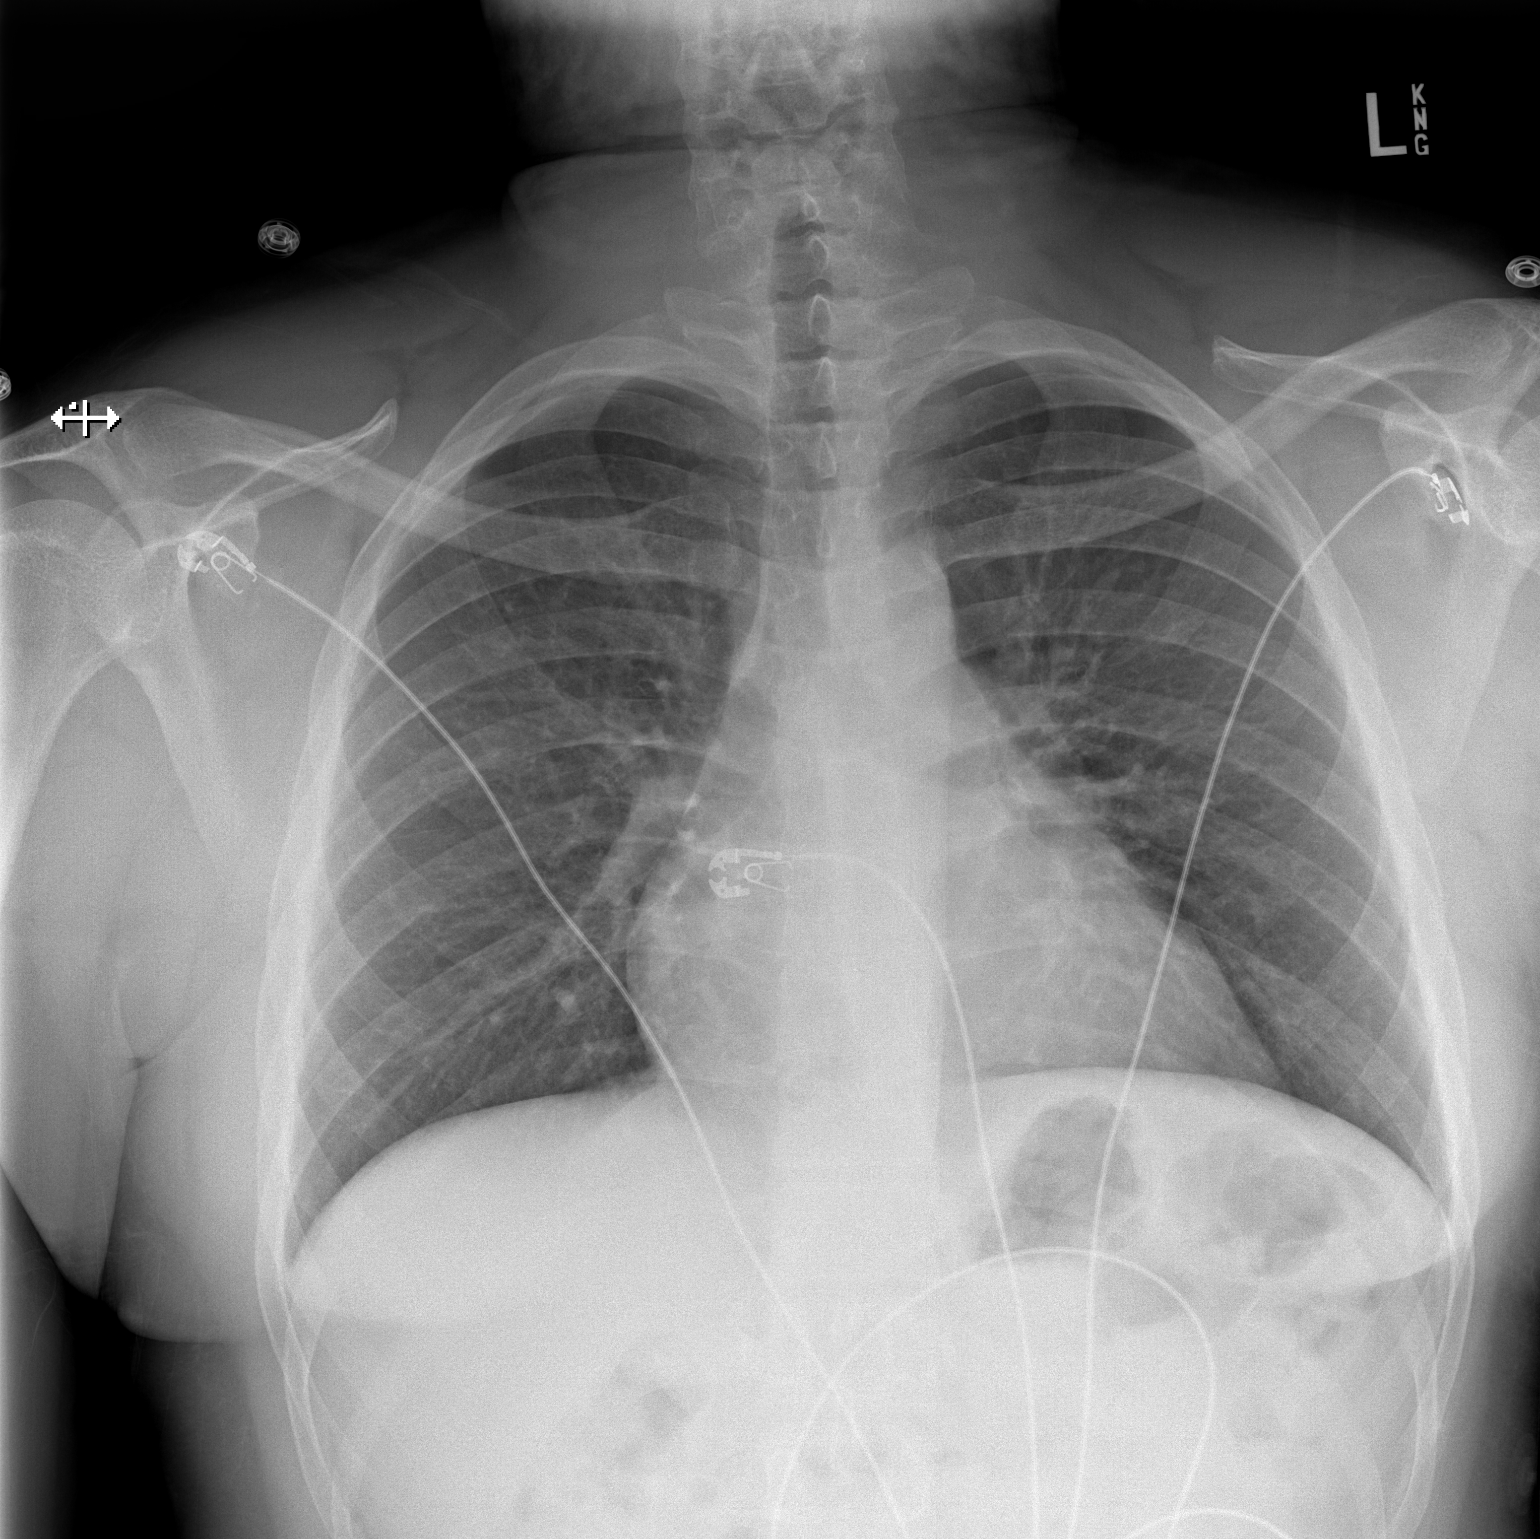

[w chest lat]
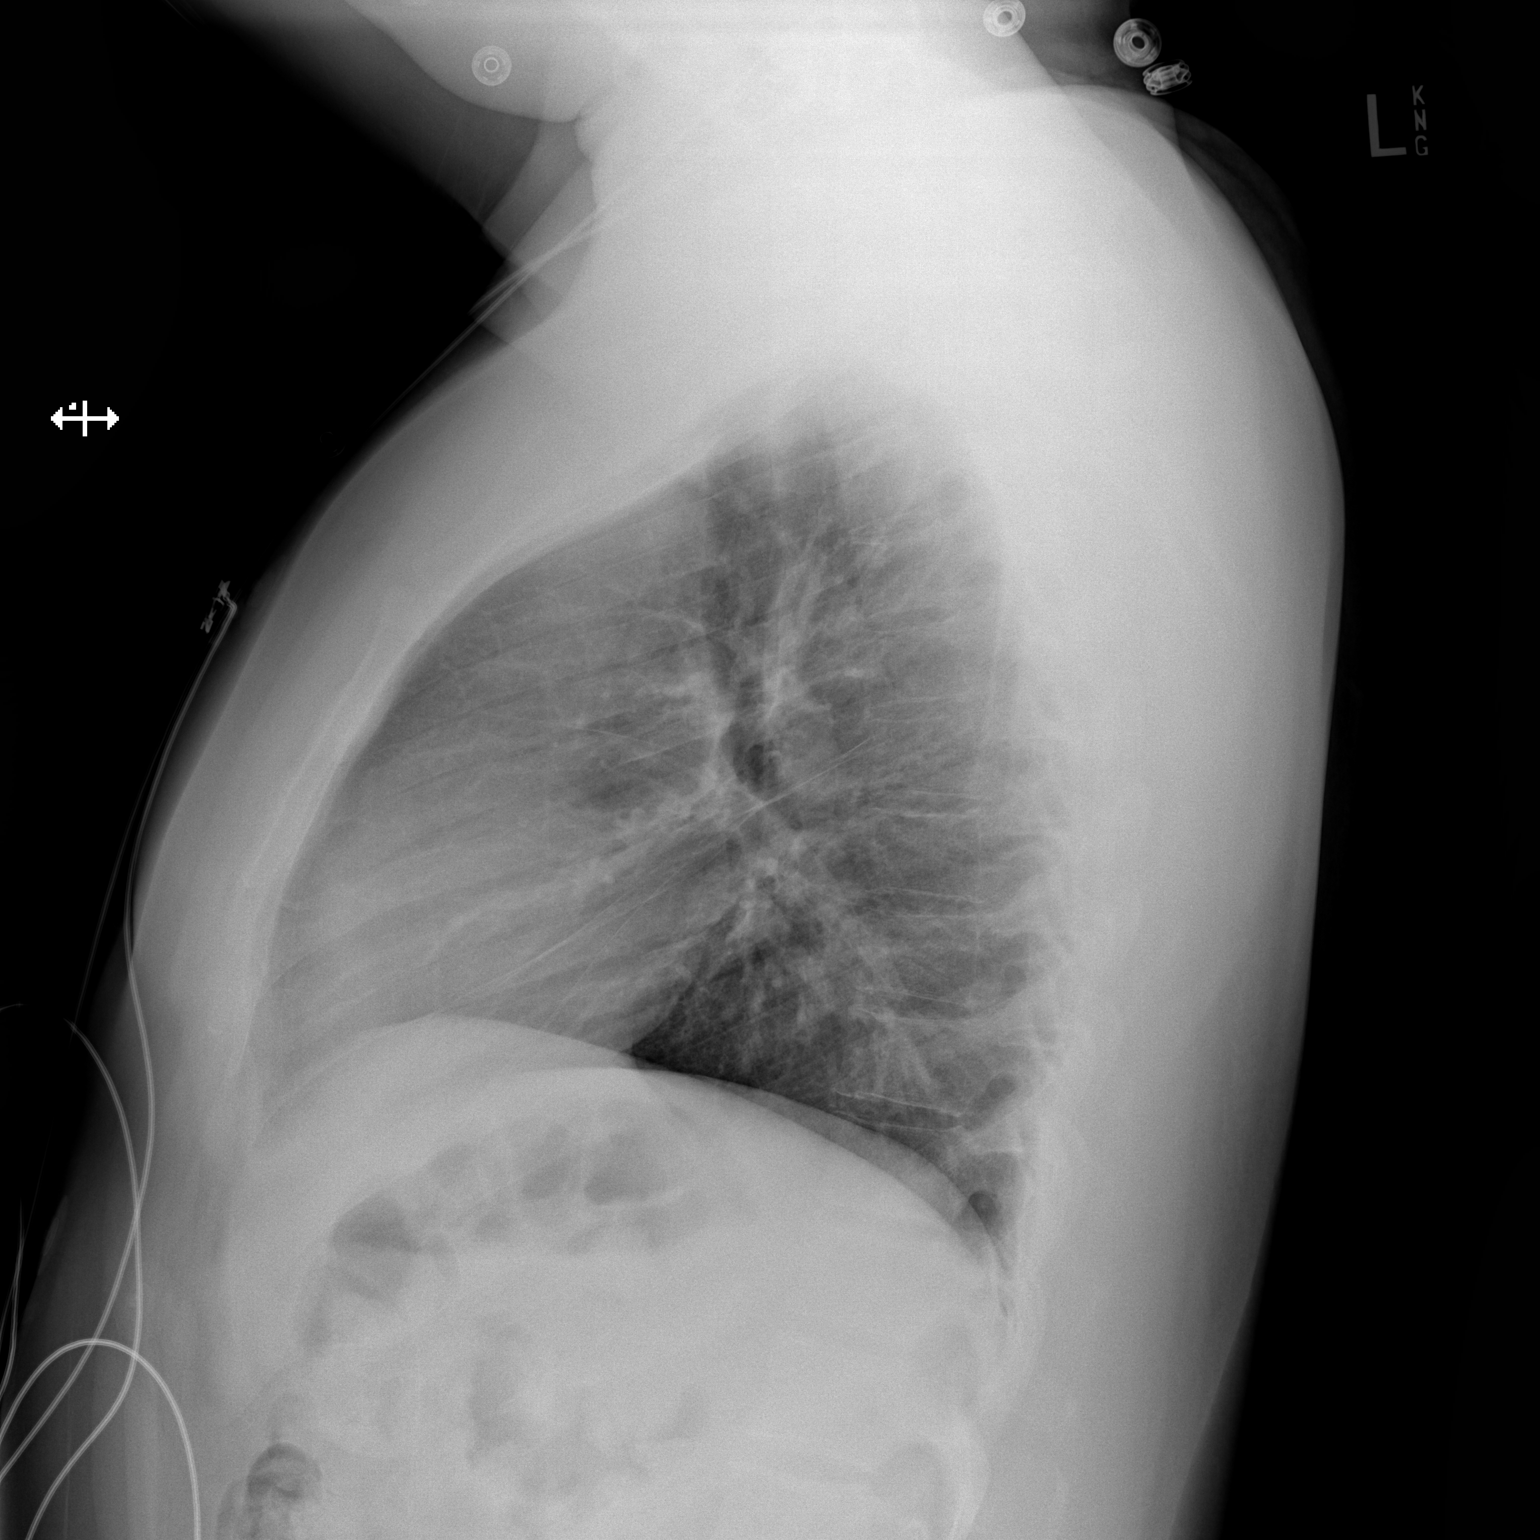

[2 of 2 positions shown; findings below may reference images not displayed]

FINDINGS: The heart size and mediastinal contours are within normal limits.
Both lungs are clear. The visualized skeletal structures are
unremarkable.
IMPRESSION: No active cardiopulmonary disease.

## 2016-10-15 ENCOUNTER — Other Ambulatory Visit (INDEPENDENT_AMBULATORY_CARE_PROVIDER_SITE_OTHER): Payer: Self-pay

## 2016-10-15 DIAGNOSIS — B20 Human immunodeficiency virus [HIV] disease: Secondary | ICD-10-CM

## 2016-10-15 DIAGNOSIS — Z113 Encounter for screening for infections with a predominantly sexual mode of transmission: Secondary | ICD-10-CM

## 2016-10-15 DIAGNOSIS — Z79899 Other long term (current) drug therapy: Secondary | ICD-10-CM

## 2016-10-15 DIAGNOSIS — E7849 Other hyperlipidemia: Secondary | ICD-10-CM

## 2016-10-15 LAB — COMPREHENSIVE METABOLIC PANEL
ALT: 21 U/L (ref 9–46)
AST: 19 U/L (ref 10–40)
Albumin: 4 g/dL (ref 3.6–5.1)
Alkaline Phosphatase: 59 U/L (ref 40–115)
BUN: 17 mg/dL (ref 7–25)
CO2: 28 mmol/L (ref 20–31)
Calcium: 8.9 mg/dL (ref 8.6–10.3)
Chloride: 106 mmol/L (ref 98–110)
Creat: 1.26 mg/dL (ref 0.60–1.35)
Glucose, Bld: 105 mg/dL — ABNORMAL HIGH (ref 65–99)
Potassium: 4.2 mmol/L (ref 3.5–5.3)
Sodium: 141 mmol/L (ref 135–146)
Total Bilirubin: 0.6 mg/dL (ref 0.2–1.2)
Total Protein: 6.9 g/dL (ref 6.1–8.1)

## 2016-10-15 LAB — CBC WITH DIFFERENTIAL/PLATELET
Basophils Absolute: 0 cells/uL (ref 0–200)
Basophils Relative: 0 %
EOS PCT: 1 %
Eosinophils Absolute: 58 cells/uL (ref 15–500)
HEMATOCRIT: 46.5 % (ref 38.5–50.0)
Hemoglobin: 15.2 g/dL (ref 13.2–17.1)
LYMPHS ABS: 2146 {cells}/uL (ref 850–3900)
Lymphocytes Relative: 37 %
MCH: 29.3 pg (ref 27.0–33.0)
MCHC: 32.7 g/dL (ref 32.0–36.0)
MCV: 89.8 fL (ref 80.0–100.0)
MONO ABS: 406 {cells}/uL (ref 200–950)
MPV: 10.4 fL (ref 7.5–12.5)
Monocytes Relative: 7 %
NEUTROS PCT: 55 %
Neutro Abs: 3190 cells/uL (ref 1500–7800)
Platelets: 182 10*3/uL (ref 140–400)
RBC: 5.18 MIL/uL (ref 4.20–5.80)
RDW: 13.7 % (ref 11.0–15.0)
WBC: 5.8 10*3/uL (ref 3.8–10.8)

## 2016-10-15 LAB — LIPID PANEL
CHOL/HDL RATIO: 2.7 ratio (ref <5.0)
Cholesterol: 201 mg/dL — ABNORMAL HIGH (ref <200)
HDL: 75 mg/dL (ref >40)
LDL CALC: 114 mg/dL — AB (ref <100)
Triglycerides: 62 mg/dL (ref <150)
VLDL: 12 mg/dL (ref <30)

## 2016-10-16 LAB — RPR

## 2016-10-16 LAB — T-HELPER CELL (CD4) - (RCID CLINIC ONLY)
CD4 % Helper T Cell: 31 % — ABNORMAL LOW (ref 33–55)
CD4 T CELL ABS: 700 /uL (ref 400–2700)

## 2016-10-17 ENCOUNTER — Encounter: Payer: Self-pay | Admitting: Internal Medicine

## 2016-10-17 LAB — URINE CYTOLOGY ANCILLARY ONLY
CHLAMYDIA, DNA PROBE: NEGATIVE
Neisseria Gonorrhea: NEGATIVE

## 2016-10-17 LAB — QUANTIFERON TB GOLD ASSAY (BLOOD)
INTERFERON GAMMA RELEASE ASSAY: POSITIVE — AB
Mitogen-Nil: 9.62 IU/mL
Quantiferon Nil Value: 0.08 IU/mL
Quantiferon Tb Ag Minus Nil Value: 9.18 IU/mL

## 2016-10-18 LAB — HIV-1 RNA QUANT-NO REFLEX-BLD
HIV 1 RNA Quant: <20 copies/mL
HIV-1 RNA QUANT, LOG: NOT DETECTED {Log_copies}/mL

## 2016-10-29 ENCOUNTER — Ambulatory Visit: Payer: Self-pay | Admitting: Internal Medicine

## 2016-10-31 ENCOUNTER — Encounter: Payer: Self-pay | Admitting: Internal Medicine

## 2016-10-31 ENCOUNTER — Ambulatory Visit (INDEPENDENT_AMBULATORY_CARE_PROVIDER_SITE_OTHER): Payer: Self-pay | Admitting: Internal Medicine

## 2016-10-31 VITALS — BP 142/101 | HR 73 | Temp 98.1°F | Ht 73.0 in | Wt 218.0 lb

## 2016-10-31 DIAGNOSIS — E7800 Pure hypercholesterolemia, unspecified: Secondary | ICD-10-CM

## 2016-10-31 DIAGNOSIS — Z23 Encounter for immunization: Secondary | ICD-10-CM

## 2016-10-31 DIAGNOSIS — Z5181 Encounter for therapeutic drug level monitoring: Secondary | ICD-10-CM | POA: Insufficient documentation

## 2016-10-31 DIAGNOSIS — B20 Human immunodeficiency virus [HIV] disease: Secondary | ICD-10-CM

## 2016-10-31 MED ORDER — EMTRICITAB-RILPIVIR-TENOFOV AF 200-25-25 MG PO TABS: 1.0000 | ORAL_TABLET | Freq: Every day | ORAL | 11 refills | Status: DC

## 2016-10-31 NOTE — Assessment & Plan Note (Signed)
Doing well on his regimen, no changes.  rtc 4-5 months.

## 2016-10-31 NOTE — Assessment & Plan Note (Signed)
LDL in a good range. Continue the statin.

## 2016-10-31 NOTE — Assessment & Plan Note (Signed)
Creat, LFTs ok 

## 2016-10-31 NOTE — Progress Notes (Signed)
   Subjective:    Patient ID: Edward Archer, male    DOB: 09/05/1975, 41 y.o.   MRN: [id number]  HPI Here for follow up of HIV> Has been in jail and realeased about 2 weeks ago.  He was gone 2 years.  No changes, [facility] and also pravastatin.  Does not miss any doses.  Feels well today.  CD4 700, viral load < 20.  No associated n/v/d.    Review of Systems  Constitutional: Positive for fatigue.       Some recent fatigue since leaving jail  Gastrointestinal: Negative for nausea.  Neurological: Negative for dizziness.       Objective:   Physical Exam  Constitutional: He appears well-developed and well-nourished. No distress.  Eyes: No scleral icterus.  Cardiovascular: Normal rate, regular rhythm and normal heart sounds.   No murmur heard. Pulmonary/Chest: Effort normal and breath sounds normal. No respiratory distress.  Lymphadenopathy:    He has no cervical adenopathy.  Skin: No rash noted.    SH: + sexually active, uses condoms always      Assessment & Plan:

## 2016-12-09 ENCOUNTER — Ambulatory Visit: Payer: Self-pay

## 2017-01-08 ENCOUNTER — Encounter: Payer: Self-pay | Admitting: Internal Medicine

## 2017-02-17 ENCOUNTER — Other Ambulatory Visit: Payer: Self-pay

## 2017-02-17 DIAGNOSIS — B20 Human immunodeficiency virus [HIV] disease: Secondary | ICD-10-CM

## 2017-02-18 LAB — T-HELPER CELL (CD4) - (RCID CLINIC ONLY)
CD4 % Helper T Cell: 28 % — ABNORMAL LOW (ref 33–55)
CD4 T CELL ABS: 540 /uL (ref 400–2700)

## 2017-02-19 LAB — HIV-1 RNA QUANT-NO REFLEX-BLD
HIV 1 RNA QUANT: 82 {copies}/mL — AB
HIV-1 RNA QUANT, LOG: 1.91 {Log_copies}/mL — AB

## 2017-03-04 ENCOUNTER — Ambulatory Visit (INDEPENDENT_AMBULATORY_CARE_PROVIDER_SITE_OTHER): Payer: Self-pay | Admitting: Internal Medicine

## 2017-03-04 ENCOUNTER — Encounter: Payer: Self-pay | Admitting: Internal Medicine

## 2017-03-04 VITALS — BP 133/92 | HR 70 | Temp 98.0°F | Ht 73.0 in | Wt 227.0 lb

## 2017-03-04 DIAGNOSIS — E7800 Pure hypercholesterolemia, unspecified: Secondary | ICD-10-CM

## 2017-03-04 DIAGNOSIS — Z113 Encounter for screening for infections with a predominantly sexual mode of transmission: Secondary | ICD-10-CM

## 2017-03-04 DIAGNOSIS — I10 Essential (primary) hypertension: Secondary | ICD-10-CM

## 2017-03-04 DIAGNOSIS — B20 Human immunodeficiency virus [HIV] disease: Secondary | ICD-10-CM

## 2017-03-04 DIAGNOSIS — Z7189 Other specified counseling: Secondary | ICD-10-CM

## 2017-03-04 DIAGNOSIS — Z23 Encounter for immunization: Secondary | ICD-10-CM

## 2017-03-04 DIAGNOSIS — Z7185 Encounter for immunization safety counseling: Secondary | ICD-10-CM

## 2017-03-04 LAB — LIPID PANEL
CHOLESTEROL: 231 mg/dL — AB (ref <200)
HDL: 69 mg/dL (ref >40)
LDL Cholesterol (Calc): 147 mg/dL (calc) — ABNORMAL HIGH
Non-HDL Cholesterol (Calc): 162 mg/dL (calc) — ABNORMAL HIGH (ref <130)
Total CHOL/HDL Ratio: 3.3 (calc) (ref <5.0)
Triglycerides: 62 mg/dL (ref <150)

## 2017-03-04 NOTE — Addendum Note (Signed)
Addended byJimmy Picket: ABBITT, KATRINA F on: 03/04/2017 09:28 AM   Modules accepted: Orders

## 2017-03-04 NOTE — Assessment & Plan Note (Signed)
Doing well.   Will continue with same and rtc 6 months.

## 2017-03-04 NOTE — Progress Notes (Signed)
   Subjective:    Patient ID: Edward Archer, male    DOB: 07/07/1975, 41 y.o.   MRN: [id number]  HPI Here for follow up of HIV  He has been on Odefsey no issues.  Does not miss any doses.  No associated n/v/d.  Feels well today.  CD4 540, viral load just 82 copies. Occasional headaches but does not occur when waking, relieved on its own.  Asking if he can take acetaminophen.     Review of Systems  Constitutional: Negative for fatigue.  Eyes: Negative for visual disturbance.  Gastrointestinal: Negative for nausea.  Skin: Negative for rash.  Neurological: Negative for dizziness and light-headedness.       Objective:   Physical Exam  Constitutional: He appears well-developed and well-nourished. No distress.  Eyes: No scleral icterus.  Cardiovascular: Normal rate, regular rhythm and normal heart sounds.   No murmur heard. Pulmonary/Chest: Effort normal and breath sounds normal. No respiratory distress.  Lymphadenopathy:    He has no cervical adenopathy.  Skin: No rash noted.    SH: + sexually active, uses condoms always; no tobacco      Assessment & Plan:

## 2017-03-04 NOTE — Assessment & Plan Note (Signed)
BP is up today.  He will check at home and call if it remains elevated.  Will otherwise continue to monitor.  I discussed reducing salt intake, avoiding fast food, increasing exercise.

## 2017-03-04 NOTE — Assessment & Plan Note (Signed)
Counseled on flu shot and given today 

## 2017-07-05 ENCOUNTER — Emergency Department (HOSPITAL_COMMUNITY): Payer: Self-pay

## 2017-07-05 ENCOUNTER — Other Ambulatory Visit: Payer: Self-pay

## 2017-07-05 ENCOUNTER — Emergency Department (HOSPITAL_COMMUNITY)
Admission: EM | Admit: 2017-07-05 | Discharge: 2017-07-05 | Disposition: A | Discharge status: Home / Self Care | Payer: Self-pay | Attending: Emergency Medicine | Admitting: Emergency Medicine

## 2017-07-05 ENCOUNTER — Encounter (HOSPITAL_COMMUNITY): Payer: Self-pay | Admitting: Emergency Medicine

## 2017-07-05 DIAGNOSIS — I10 Essential (primary) hypertension: Secondary | ICD-10-CM | POA: Insufficient documentation

## 2017-07-05 DIAGNOSIS — R079 Chest pain, unspecified: Secondary | ICD-10-CM | POA: Insufficient documentation

## 2017-07-05 DIAGNOSIS — R0602 Shortness of breath: Secondary | ICD-10-CM | POA: Insufficient documentation

## 2017-07-05 DIAGNOSIS — Z79899 Other long term (current) drug therapy: Secondary | ICD-10-CM | POA: Insufficient documentation

## 2017-07-05 DIAGNOSIS — Z21 Asymptomatic human immunodeficiency virus [HIV] infection status: Secondary | ICD-10-CM | POA: Insufficient documentation

## 2017-07-05 LAB — BASIC METABOLIC PANEL
Anion gap: 10 (ref 5–15)
BUN: 15 mg/dL (ref 6–20)
CHLORIDE: 106 mmol/L (ref 101–111)
CO2: 23 mmol/L (ref 22–32)
CREATININE: 1.25 mg/dL — AB (ref 0.61–1.24)
Calcium: 8.7 mg/dL — ABNORMAL LOW (ref 8.9–10.3)
GFR calc Af Amer: >60 mL/min (ref >60)
GFR calc non Af Amer: >60 mL/min (ref >60)
Glucose, Bld: 128 mg/dL — ABNORMAL HIGH (ref 65–99)
Potassium: 3.5 mmol/L (ref 3.5–5.1)
Sodium: 139 mmol/L (ref 135–145)

## 2017-07-05 LAB — CBC
HEMATOCRIT: 46.4 % (ref 39.0–52.0)
Hemoglobin: 15.3 g/dL (ref 13.0–17.0)
MCH: 29.6 pg (ref 26.0–34.0)
MCHC: 33 g/dL (ref 30.0–36.0)
MCV: 89.7 fL (ref 78.0–100.0)
PLATELETS: 192 10*3/uL (ref 150–400)
RBC: 5.17 MIL/uL (ref 4.22–5.81)
RDW: 13 % (ref 11.5–15.5)
WBC: 6.1 10*3/uL (ref 4.0–10.5)

## 2017-07-05 LAB — I-STAT TROPONIN, ED
Troponin i, poc: 0 ng/mL (ref 0.00–0.08)
Troponin i, poc: 0 ng/mL (ref 0.00–0.08)

## 2017-07-05 MED ORDER — METHOCARBAMOL 750 MG PO TABS: 750.0000 mg | ORAL_TABLET | Freq: Three times a day (TID) | ORAL | 0 refills | Status: DC | PRN

## 2017-07-05 NOTE — Discharge Instructions (Signed)
The best way to get rid of muscle pain is by taking NSAIDS, using heat, massage therapy, and gentle stretching/range of motion exercises.   Please make sure you are staying well hydrated.  Your labs today showed mild dehydration which can contribute to muscle spasms.

## 2017-07-05 NOTE — ED Provider Notes (Signed)
[facility] EMERGENCY DEPARTMENT Provider Note   CSN: [id number] Arrival date & time: 07/05/17  0253     History   Chief Complaint Chief Complaint  Patient presents with  . Chest Pain    HPI Edward Archer is a 42 y.o. male with a history of HIV, hyperlipidemia, HTN, anxiety presents today for evaluation of chest pain radiating into his right arm and shortness of breath.  He reports that that started last night at around 1-2 am.  He reports that his chest pain felt dull and was a 7/10. He reports that he has had similar feelings in the past that was diagnosed as anxiety.  He  Reports that currently his pain is a 9/10 in his right posterior shoulder.  He denies diaphoresis, N/V/D.  No history of heart disease.  He reports that he started coughing occasionally yesterday but denies any other symptoms.  At this time his chest pain and shortness of breath have fully resolved.  He denies any neck pain, no numbness or tingling.    Chart review shows last CD4 count over 500.  He reports compliance with his antiretroviral therapy.   HPI  Past Medical History:  Diagnosis Date  . HIV infection (HCC)   . Hyperlipidemia   . 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 Screening examination for venereal disease 03/04/2017  . Vaccine counseling 03/04/2017  . Medication monitoring encounter 10/31/2016  . HTN (hypertension) 05/26/2014  . Seasonal allergies 12/30/2013  . Hyperglycemia 06/10/2013  . Hypercholesterolemia 03/26/2011  . Human immunodeficiency virus (HIV) disease (HCC) 03/18/2011  . 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 No pertinent surgical history.     Home Medications    Prior to Admission medications   Medication Sig Start Date End Date Taking? Authorizing Provider  emtricitabine-rilpivir-tenofovir AF (ODEFSEY) 200-25-25 MG TABS tablet Take 1  tablet by mouth daily with breakfast. 10/31/16   Comer, Belia Heman, MD  methocarbamol (ROBAXIN) 750 MG tablet Take 1-2 tablets (750-1,500 mg total) by mouth 3 (three) times daily as needed for muscle spasms. 07/05/17   Cristina Gong, PA-C  pravastatin (PRAVACHOL) 20 MG tablet TAKE 1 TABLET BY MOUTH EVERY MORNING Patient not taking: Reported on 03/04/2017    Gardiner Barefoot, MD    Family History No family history on file.  Social History Social History   Tobacco Use  . Smoking status: Never Smoker  . Smokeless tobacco: Never Used  Substance Use Topics  . Alcohol use: No  . Drug use: No     Allergies   Patient has no known allergies.   Review of Systems Review of Systems  Constitutional: Negative for chills, diaphoresis, fatigue and fever.  HENT: Negative for congestion, ear pain, facial swelling, nosebleeds, sore throat and trouble swallowing.   Respiratory: Positive for cough and shortness of breath (Fully resolved. ). Negative for chest tightness.   Cardiovascular: Positive for chest pain (Fully resolved. ). Negative for palpitations and leg swelling.  Gastrointestinal: Negative for abdominal pain, diarrhea, nausea and vomiting.  Musculoskeletal: Negative for neck pain and neck stiffness.  Skin: Negative for rash.  Neurological: Negative for dizziness, weakness and headaches.  Psychiatric/Behavioral: Negative for confusion.  All other systems reviewed and are negative.    Physical Exam Updated Vital Signs BP (!) 139/99 (BP Location: Right Arm)   Pulse 69  Temp 97.6 F (36.4 C) (Oral)   Resp (!) 24   Ht 6\' 1"  (1.854 m)   SpO2 100%   BMI 29.95 kg/m   Physical Exam  Constitutional: He is oriented to person, place, and time. He appears well-developed and well-nourished. He does not appear ill. No distress.  HENT:  Head: Normocephalic and atraumatic.  Eyes: Conjunctivae are normal.  Neck: Normal range of motion. Neck supple.  Cardiovascular: Normal rate,  regular rhythm and normal pulses.  No murmur heard. Pulses:      Radial pulses are 2+ on the right side, and 2+ on the left side.       Posterior tibial pulses are 2+ on the right side, and 2+ on the left side.  Pulmonary/Chest: Effort normal and breath sounds normal. No respiratory distress. He has no decreased breath sounds. He has no wheezes. He has no rhonchi. He has no rales.  Abdominal: Soft. Bowel sounds are normal. He exhibits no distension. There is no tenderness. There is no guarding.  Musculoskeletal: He exhibits no edema.       Right lower leg: Normal. He exhibits no tenderness and no edema.       Left lower leg: Normal. He exhibits no tenderness and no edema.  No neck TTP.  There is TTP over right posterior shoulder.  His reported pain is recreated with palpation of the area, Abduction of the shoulder and flexion of the shoulder.  Area is not red, warm or superficially tender over the shoulder joint it's self.  No crepitus or deformities.  5/5 grip strength bilaterally.   Lymphadenopathy:    He has no cervical adenopathy.  Neurological: He is alert and oriented to person, place, and time.  Skin: Skin is warm and dry.  Psychiatric: He has a normal mood and affect. His behavior is normal.  Nursing note and vitals reviewed.    ED Treatments / Results  Labs (all labs ordered are listed, but only abnormal results are displayed) Labs Reviewed  BASIC METABOLIC PANEL - Abnormal; Notable for the following components:      Result Value   Glucose, Bld 128 (*)    Creatinine, Ser 1.25 (*)    Calcium 8.7 (*)    All other components within normal limits  CBC  I-STAT TROPONIN, ED  I-STAT TROPONIN, ED    EKG  EKG Interpretation  Date/Time:  Saturday July 05 2017 02:57:48 EST Ventricular Rate:  88 PR Interval:  174 QRS Duration: 82 QT Interval:  372 QTC Calculation: 450 R Axis:   43 Text Interpretation:  Normal sinus rhythm Possible Left atrial enlargement No significant  change since last tracing Confirmed by Cathren LaineSteinl, Kevin ([id number]) on 07/05/2017 9:10:59 AM       Radiology Dg Chest 2 View  Result Date: 07/05/2017 CLINICAL DATA:  42 year old male with chest pain. EXAM: CHEST  2 VIEW COMPARISON:  Chest radiograph dated 04/25/2013 FINDINGS: The heart size and mediastinal contours are within normal limits. Both lungs are clear. The visualized skeletal structures are unremarkable. IMPRESSION: No active cardiopulmonary disease. Electronically Signed   By: Elgie CollardArash  Radparvar M.D.   On: 07/05/2017 03:26    Procedures Procedures (including critical care time)  Medications Ordered in ED Medications - No data to display   Initial Impression / Assessment and Plan / ED Course  I have reviewed the triage vital signs and the nursing notes.  Pertinent labs & imaging results that were available during my care of the patient were reviewed by  me and considered in my medical decision making (see chart for details).    Patient is to be discharged with recommendation to follow up with PCP in regards to today's hospital visit. Chest pain is not likely of cardiac or pulmonary etiology d/t presentation, PERC negative, VSS, no tracheal deviation, no JVD or new murmur, RRR, breath sounds equal bilaterally, EKG without acute abnormalities, negative troponin, and negative CXR. Heart score 1.  Pt has been advised to return to the ED if CP becomes exertional, associated with diaphoresis or nausea, radiates to left jaw/arm, worsens or becomes concerning in any way. Pt appears reliable for follow up and is agreeable to discharge. Suspect that pain is MSK in nature as it is currently localized to superior posterior right shoulder, recreated with arm movement and palpation.  No evidence of septic arthritis at this time.  Intact radial pulses bilaterally.     Final Clinical Impressions(s) / ED Diagnoses   Final diagnoses:  Chest pain, unspecified type    ED Discharge Orders        Ordered      methocarbamol (ROBAXIN) 750 MG tablet  3 times daily PRN     07/05/17 0915       Cristina Gong, PA-C 07/05/17 1610    Cathren Laine, MD 07/06/17 [phone]

## 2017-07-05 NOTE — ED Notes (Signed)
Provider at the bedside.  

## 2017-07-05 NOTE — ED Triage Notes (Signed)
Pt c/o chest pain that radiates to his right arm that started tonight, reports shortness of breath that has resolved.  Hx anxiety.

## 2017-08-26 ENCOUNTER — Other Ambulatory Visit: Payer: Self-pay

## 2017-08-26 ENCOUNTER — Encounter: Payer: Self-pay | Admitting: Internal Medicine

## 2017-08-26 DIAGNOSIS — Z113 Encounter for screening for infections with a predominantly sexual mode of transmission: Secondary | ICD-10-CM

## 2017-08-26 DIAGNOSIS — B20 Human immunodeficiency virus [HIV] disease: Secondary | ICD-10-CM

## 2017-08-27 LAB — CBC WITH DIFFERENTIAL/PLATELET
BASOS ABS: 11 {cells}/uL (ref 0–200)
Basophils Relative: 0.2 %
EOS PCT: 2.5 %
Eosinophils Absolute: 133 cells/uL (ref 15–500)
HEMATOCRIT: 45.2 % (ref 38.5–50.0)
HEMOGLOBIN: 15 g/dL (ref 13.2–17.1)
LYMPHS ABS: 2072 {cells}/uL (ref 850–3900)
MCH: 29.2 pg (ref 27.0–33.0)
MCHC: 33.2 g/dL (ref 32.0–36.0)
MCV: 87.9 fL (ref 80.0–100.0)
MPV: 11 fL (ref 7.5–12.5)
Monocytes Relative: 9.1 %
NEUTROS ABS: 2602 {cells}/uL (ref 1500–7800)
Neutrophils Relative %: 49.1 %
Platelets: 178 10*3/uL (ref 140–400)
RBC: 5.14 10*6/uL (ref 4.20–5.80)
RDW: 12.8 % (ref 11.0–15.0)
Total Lymphocyte: 39.1 %
WBC: 5.3 10*3/uL (ref 3.8–10.8)
WBCMIX: 482 {cells}/uL (ref 200–950)

## 2017-08-27 LAB — COMPLETE METABOLIC PANEL WITH GFR
AG Ratio: 1.8 (calc) (ref 1.0–2.5)
ALBUMIN MSPROF: 4.1 g/dL (ref 3.6–5.1)
ALT: 27 U/L (ref 9–46)
AST: 25 U/L (ref 10–40)
Alkaline phosphatase (APISO): 48 U/L (ref 40–115)
BUN / CREAT RATIO: 13 (calc) (ref 6–22)
BUN: 19 mg/dL (ref 7–25)
CALCIUM: 8.6 mg/dL (ref 8.6–10.3)
CO2: 27 mmol/L (ref 20–32)
CREATININE: 1.46 mg/dL — AB (ref 0.60–1.35)
Chloride: 107 mmol/L (ref 98–110)
GFR, EST NON AFRICAN AMERICAN: 58 mL/min/{1.73_m2} — AB (ref >=60)
GFR, Est African American: 67 mL/min/{1.73_m2} (ref >=60)
GLOBULIN: 2.3 g/dL (ref 1.9–3.7)
Glucose, Bld: 107 mg/dL — ABNORMAL HIGH (ref 65–99)
Potassium: 4.1 mmol/L (ref 3.5–5.3)
SODIUM: 140 mmol/L (ref 135–146)
Total Bilirubin: 0.7 mg/dL (ref 0.2–1.2)
Total Protein: 6.4 g/dL (ref 6.1–8.1)

## 2017-08-27 LAB — RPR: RPR: NONREACTIVE

## 2017-08-27 LAB — T-HELPER CELL (CD4) - (RCID CLINIC ONLY)
CD4 T CELL ABS: 680 /uL (ref 400–2700)
CD4 T CELL HELPER: 31 % — AB (ref 33–55)

## 2017-08-27 LAB — URINE CYTOLOGY ANCILLARY ONLY
CHLAMYDIA, DNA PROBE: NEGATIVE
Neisseria Gonorrhea: NEGATIVE

## 2017-08-28 LAB — HIV-1 RNA QUANT-NO REFLEX-BLD
HIV 1 RNA Quant: <20 copies/mL
HIV-1 RNA Quant, Log: <1.3 Log copies/mL

## 2017-09-09 ENCOUNTER — Encounter: Payer: Self-pay | Admitting: Internal Medicine

## 2017-09-09 ENCOUNTER — Ambulatory Visit (INDEPENDENT_AMBULATORY_CARE_PROVIDER_SITE_OTHER): Payer: Self-pay | Admitting: Internal Medicine

## 2017-09-09 VITALS — BP 148/99 | HR 81 | Temp 98.0°F | Ht 73.0 in | Wt 245.4 lb

## 2017-09-09 DIAGNOSIS — N181 Chronic kidney disease, stage 1: Secondary | ICD-10-CM

## 2017-09-09 DIAGNOSIS — Z113 Encounter for screening for infections with a predominantly sexual mode of transmission: Secondary | ICD-10-CM

## 2017-09-09 DIAGNOSIS — I10 Essential (primary) hypertension: Secondary | ICD-10-CM

## 2017-09-09 DIAGNOSIS — B20 Human immunodeficiency virus [HIV] disease: Secondary | ICD-10-CM

## 2017-09-09 HISTORY — DX: Chronic kidney disease, stage 1: N18.1

## 2017-09-09 NOTE — Assessment & Plan Note (Signed)
Screened negative.  No symptoms 

## 2017-09-09 NOTE — Progress Notes (Signed)
   Subjective:    Patient ID: Edward Archer, male    DOB: 1975-09-03, 42 y.o.   MRN: [id number]  HPI Here for follow up of HIV Has been on [facility] and denies any missed doses.  No new issues.  CD4 of 680, viral load < 20.  Creat mildly increased to 1.46.  RPR NR.  No associated n/v/d. BP up, increased weight.     Review of Systems  Constitutional: Negative for fatigue and unexpected weight change.  Gastrointestinal: Negative for diarrhea and nausea.  Skin: Negative for rash.       Objective:   Physical Exam  Constitutional: He appears well-developed and well-nourished.  Eyes: No scleral icterus.  Cardiovascular: Normal rate, regular rhythm and normal heart sounds.  Pulmonary/Chest: Effort normal and breath sounds normal.  Lymphadenopathy:    He has no cervical adenopathy.  Skin: No rash noted.   SH: no tobacco       Assessment & Plan:

## 2017-09-09 NOTE — Assessment & Plan Note (Signed)
Mild elevation.  May be due to his increased BP.  He is going to establish with a PCP.

## 2017-09-09 NOTE — Assessment & Plan Note (Signed)
bp up.  He is going to establish with a PCP.

## 2017-09-09 NOTE — Assessment & Plan Note (Signed)
Doing well, no issues.  rtc 6 months.  

## 2017-10-14 ENCOUNTER — Other Ambulatory Visit: Payer: Self-pay | Admitting: Internal Medicine

## 2018-02-25 ENCOUNTER — Other Ambulatory Visit: Payer: Self-pay

## 2018-02-25 DIAGNOSIS — B20 Human immunodeficiency virus [HIV] disease: Secondary | ICD-10-CM

## 2018-02-26 LAB — T-HELPER CELL (CD4) - (RCID CLINIC ONLY)
CD4 T CELL ABS: 580 /uL (ref 400–2700)
CD4 T CELL HELPER: 27 % — AB (ref 33–55)

## 2018-02-28 LAB — HIV-1 RNA QUANT-NO REFLEX-BLD
HIV 1 RNA QUANT: NOT DETECTED {copies}/mL
HIV-1 RNA QUANT, LOG: NOT DETECTED {Log_copies}/mL

## 2018-03-03 ENCOUNTER — Encounter: Payer: Self-pay | Admitting: Internal Medicine

## 2018-03-11 ENCOUNTER — Ambulatory Visit (INDEPENDENT_AMBULATORY_CARE_PROVIDER_SITE_OTHER): Payer: Self-pay | Admitting: Internal Medicine

## 2018-03-11 ENCOUNTER — Encounter: Payer: Self-pay | Admitting: Internal Medicine

## 2018-03-11 VITALS — BP 157/105 | HR 85 | Temp 98.1°F | Ht 73.0 in | Wt 254.0 lb

## 2018-03-11 DIAGNOSIS — Z113 Encounter for screening for infections with a predominantly sexual mode of transmission: Secondary | ICD-10-CM

## 2018-03-11 DIAGNOSIS — Z23 Encounter for immunization: Secondary | ICD-10-CM

## 2018-03-11 DIAGNOSIS — B20 Human immunodeficiency virus [HIV] disease: Secondary | ICD-10-CM

## 2018-03-11 DIAGNOSIS — E7800 Pure hypercholesterolemia, unspecified: Secondary | ICD-10-CM

## 2018-03-11 DIAGNOSIS — I10 Essential (primary) hypertension: Secondary | ICD-10-CM

## 2018-03-11 DIAGNOSIS — R635 Abnormal weight gain: Secondary | ICD-10-CM

## 2018-03-11 NOTE — Assessment & Plan Note (Signed)
He is aware, more sedentary, poor eating habits recently.  Discussed lifestyle modifications.

## 2018-03-11 NOTE — Assessment & Plan Note (Signed)
Doing well on current regimen.  No changes and rtc 6 months

## 2018-03-11 NOTE — Assessment & Plan Note (Signed)
Discussed Prevnar and given today Also Menveo #2, flu shot

## 2018-03-11 NOTE — Assessment & Plan Note (Signed)
Elevated today.  Discussed lifestyle modifications and weight loss.  Will monitor.  He also will monitor at a pharmacy until his next visit

## 2018-03-11 NOTE — Progress Notes (Signed)
   Subjective:    Patient ID: Edward Archer, male    DOB: Mar 23, 1976, 42 y.o.   MRN: [id number]  HPI Here for follow up of HIV Has been on [facility] and denies any missed doses.  No new issues.  CD4 of 580, viral load < 20.  No associated n/v/d. BP up, increased weight.  Less active with his new work.    Review of Systems  Constitutional: Negative for fatigue and unexpected weight change.  Gastrointestinal: Negative for diarrhea and nausea.  Skin: Negative for rash.       Objective:   Physical Exam  Constitutional: He appears well-developed and well-nourished.  Eyes: No scleral icterus.  Cardiovascular: Normal rate, regular rhythm and normal heart sounds.  Pulmonary/Chest: Effort normal and breath sounds normal.  Lymphadenopathy:    He has no cervical adenopathy.  Skin: No rash noted.   SH: no tobacco       Assessment & Plan:

## 2018-03-11 NOTE — Progress Notes (Signed)
Menveo #2/ Flu/Prevnar administered today. Patient tolerated well. Patient informed of upcoming renewal for RW/UMAP.

## 2018-04-04 ENCOUNTER — Other Ambulatory Visit: Payer: Self-pay | Admitting: Internal Medicine

## 2018-06-15 ENCOUNTER — Ambulatory Visit: Payer: Self-pay

## 2018-06-16 ENCOUNTER — Encounter: Payer: Self-pay | Admitting: Internal Medicine

## 2018-07-15 ENCOUNTER — Encounter: Payer: Self-pay | Admitting: Internal Medicine

## 2018-09-10 ENCOUNTER — Other Ambulatory Visit: Payer: Self-pay

## 2018-09-24 ENCOUNTER — Telehealth: Payer: Self-pay | Admitting: Internal Medicine

## 2018-09-24 NOTE — Telephone Encounter (Signed)
COVID-19 Pre-Screening Questions: ° °Do you currently have a fever (>100 °F), chills or unexplained body aches?no  ° °Are you currently experiencing new cough, shortness of breath, sore throat, runny nose? No  °•  °Have you recently travelled outside the state of [place] in the last 14 days? No   °•  °Have you been in contact with someone that is currently pending confirmation of Covid19 testing or has been confirmed to have the Covid19 virus? No  °

## 2018-09-27 ENCOUNTER — Other Ambulatory Visit: Payer: Self-pay | Admitting: Internal Medicine

## 2018-09-28 ENCOUNTER — Encounter: Payer: Self-pay | Admitting: Internal Medicine

## 2018-09-28 ENCOUNTER — Ambulatory Visit (INDEPENDENT_AMBULATORY_CARE_PROVIDER_SITE_OTHER): Payer: Self-pay | Admitting: Internal Medicine

## 2018-09-28 ENCOUNTER — Other Ambulatory Visit: Payer: Self-pay

## 2018-09-28 VITALS — BP 134/88 | HR 86 | Temp 98.0°F | Ht 73.0 in | Wt 256.2 lb

## 2018-09-28 DIAGNOSIS — B20 Human immunodeficiency virus [HIV] disease: Secondary | ICD-10-CM

## 2018-09-28 DIAGNOSIS — Z113 Encounter for screening for infections with a predominantly sexual mode of transmission: Secondary | ICD-10-CM

## 2018-09-28 DIAGNOSIS — E7800 Pure hypercholesterolemia, unspecified: Secondary | ICD-10-CM

## 2018-09-28 NOTE — Assessment & Plan Note (Signed)
Labs today and will repeat prior to next visit.

## 2018-09-28 NOTE — Assessment & Plan Note (Signed)
Will screen today 

## 2018-09-28 NOTE — Progress Notes (Signed)
   Subjective:    Patient ID: Edward Archer, male    DOB: 27-Feb-1976, 43 y.o.   MRN: [id number]  HPI Here for follow up of HIV Has been on [facility] and denies any missed doses.  No new issues.  CD4 of 580, viral load < 20 last time but no labs prior to todays visit.  No associated n/v/d.  Increased weight.  Less active.  Recently laid off from work.    Review of Systems  Constitutional: Negative for fatigue and unexpected weight change.  Gastrointestinal: Negative for diarrhea and nausea.  Skin: Negative for rash.       Objective:   Physical Exam Constitutional:      Appearance: He is well-developed.  Eyes:     General: No scleral icterus. Cardiovascular:     Rate and Rhythm: Normal rate and regular rhythm.     Heart sounds: Normal heart sounds.  Pulmonary:     Effort: Pulmonary effort is normal.     Breath sounds: Normal breath sounds.  Lymphadenopathy:     Cervical: No cervical adenopathy.  Skin:    Findings: No rash.    SH: no tobacco       Assessment & Plan:

## 2018-09-28 NOTE — Assessment & Plan Note (Signed)
Will check lipid panel today 

## 2018-09-29 LAB — URINE CYTOLOGY ANCILLARY ONLY
Chlamydia: NEGATIVE
Neisseria Gonorrhea: NEGATIVE

## 2018-09-29 LAB — T-HELPER CELL (CD4) - (RCID CLINIC ONLY)
CD4 % Helper T Cell: 36 % (ref 33–65)
CD4 T Cell Abs: 894 /uL (ref 400–1790)

## 2018-10-03 LAB — CBC WITH DIFFERENTIAL/PLATELET
Absolute Monocytes: 482 cells/uL (ref 200–950)
Basophils Absolute: 28 cells/uL (ref 0–200)
Basophils Relative: 0.5 %
Eosinophils Absolute: 269 cells/uL (ref 15–500)
Eosinophils Relative: 4.8 %
HCT: 45.2 % (ref 38.5–50.0)
Hemoglobin: 15 g/dL (ref 13.2–17.1)
Lymphs Abs: 2632 cells/uL (ref 850–3900)
MCH: 28.9 pg (ref 27.0–33.0)
MCHC: 33.2 g/dL (ref 32.0–36.0)
MCV: 87.1 fL (ref 80.0–100.0)
MPV: 10.9 fL (ref 7.5–12.5)
Monocytes Relative: 8.6 %
Neutro Abs: 2190 cells/uL (ref 1500–7800)
Neutrophils Relative %: 39.1 %
Platelets: 236 10*3/uL (ref 140–400)
RBC: 5.19 10*6/uL (ref 4.20–5.80)
RDW: 13.2 % (ref 11.0–15.0)
Total Lymphocyte: 47 %
WBC: 5.6 10*3/uL (ref 3.8–10.8)

## 2018-10-03 LAB — COMPLETE METABOLIC PANEL WITH GFR
AG Ratio: 1.6 (calc) (ref 1.0–2.5)
ALT: 31 U/L (ref 9–46)
AST: 36 U/L (ref 10–40)
Albumin: 4.3 g/dL (ref 3.6–5.1)
Alkaline phosphatase (APISO): 51 U/L (ref 36–130)
BUN: 18 mg/dL (ref 7–25)
CO2: 27 mmol/L (ref 20–32)
Calcium: 9.5 mg/dL (ref 8.6–10.3)
Chloride: 105 mmol/L (ref 98–110)
Creat: 1.34 mg/dL (ref 0.60–1.35)
GFR, Est African American: 74 mL/min/{1.73_m2} (ref >=60)
GFR, Est Non African American: 64 mL/min/{1.73_m2} (ref >=60)
Globulin: 2.7 g/dL (calc) (ref 1.9–3.7)
Glucose, Bld: 101 mg/dL — ABNORMAL HIGH (ref 65–99)
Potassium: 4 mmol/L (ref 3.5–5.3)
Sodium: 140 mmol/L (ref 135–146)
Total Bilirubin: 0.6 mg/dL (ref 0.2–1.2)
Total Protein: 7 g/dL (ref 6.1–8.1)

## 2018-10-03 LAB — RPR: RPR Ser Ql: NONREACTIVE

## 2018-10-03 LAB — LIPID PANEL
Cholesterol: 261 mg/dL — ABNORMAL HIGH (ref <200)
HDL: 48 mg/dL (ref >=40)
LDL Cholesterol (Calc): 186 mg/dL (calc) — ABNORMAL HIGH
Non-HDL Cholesterol (Calc): 213 mg/dL (calc) — ABNORMAL HIGH (ref <130)
Total CHOL/HDL Ratio: 5.4 (calc) — ABNORMAL HIGH (ref <5.0)
Triglycerides: 134 mg/dL (ref <150)

## 2018-10-03 LAB — HIV-1 RNA QUANT-NO REFLEX-BLD
HIV 1 RNA Quant: <20 copies/mL
HIV-1 RNA Quant, Log: <1.3 Log copies/mL

## 2019-03-11 ENCOUNTER — Encounter: Payer: Self-pay | Admitting: Internal Medicine

## 2019-03-12 ENCOUNTER — Other Ambulatory Visit: Payer: Self-pay | Admitting: Internal Medicine

## 2019-03-31 ENCOUNTER — Other Ambulatory Visit: Payer: Self-pay

## 2019-03-31 DIAGNOSIS — B20 Human immunodeficiency virus [HIV] disease: Secondary | ICD-10-CM

## 2019-04-01 LAB — T-HELPER CELL (CD4) - (RCID CLINIC ONLY)
CD4 % Helper T Cell: 39 % (ref 33–65)
CD4 T Cell Abs: 710 /uL (ref 400–1790)

## 2019-04-07 LAB — HIV-1 RNA QUANT-NO REFLEX-BLD
HIV 1 RNA Quant: <20 copies/mL
HIV-1 RNA Quant, Log: <1.3 Log copies/mL

## 2019-04-14 ENCOUNTER — Ambulatory Visit (INDEPENDENT_AMBULATORY_CARE_PROVIDER_SITE_OTHER): Payer: Self-pay | Admitting: Internal Medicine

## 2019-04-14 ENCOUNTER — Encounter: Payer: Self-pay | Admitting: Internal Medicine

## 2019-04-14 ENCOUNTER — Other Ambulatory Visit: Payer: Self-pay

## 2019-04-14 VITALS — BP 163/108 | HR 84 | Temp 98.1°F | Ht 73.0 in | Wt 248.0 lb

## 2019-04-14 DIAGNOSIS — Z7185 Encounter for immunization safety counseling: Secondary | ICD-10-CM

## 2019-04-14 DIAGNOSIS — Z23 Encounter for immunization: Secondary | ICD-10-CM

## 2019-04-14 DIAGNOSIS — E7800 Pure hypercholesterolemia, unspecified: Secondary | ICD-10-CM

## 2019-04-14 DIAGNOSIS — I10 Essential (primary) hypertension: Secondary | ICD-10-CM

## 2019-04-14 DIAGNOSIS — Z113 Encounter for screening for infections with a predominantly sexual mode of transmission: Secondary | ICD-10-CM

## 2019-04-14 DIAGNOSIS — Z7189 Other specified counseling: Secondary | ICD-10-CM

## 2019-04-14 DIAGNOSIS — R635 Abnormal weight gain: Secondary | ICD-10-CM

## 2019-04-14 DIAGNOSIS — B20 Human immunodeficiency virus [HIV] disease: Secondary | ICD-10-CM

## 2019-04-14 NOTE — Addendum Note (Signed)
Addended by: Landis Gandy on: 04/14/2019 01:39 PM   Modules accepted: Orders

## 2019-04-14 NOTE — Assessment & Plan Note (Signed)
Will check his cholesterol today

## 2019-04-14 NOTE — Assessment & Plan Note (Signed)
Elevated.  He likely will need treatment.  Will help facilitate him getting into primary care

## 2019-04-14 NOTE — Assessment & Plan Note (Signed)
Doing well and no issues. rtc 6 months.  

## 2019-04-14 NOTE — Progress Notes (Signed)
   Subjective:    Patient ID: Edward Archer, male    DOB: 11-Dec-1975, 43 y.o.   MRN: [id number]  HPI Here for follow up of HIV CD4 of 710, viral load < 20.  Has gained some weight and BP up.  Not exercising. No associated nv/d.  No new issues.    Review of Systems  Gastrointestinal: Negative for diarrhea and nausea.  Skin: Negative for rash.       Objective:   Physical Exam Constitutional:      Appearance: Normal appearance. He is well-developed.  Eyes:     General: No scleral icterus. Cardiovascular:     Rate and Rhythm: Normal rate and regular rhythm.     Heart sounds: Normal heart sounds.  Pulmonary:     Effort: Pulmonary effort is normal.     Breath sounds: Normal breath sounds.  Skin:    Findings: No rash.  Neurological:     General: No focal deficit present.     Mental Status: He is alert.  Psychiatric:        Mood and Affect: Mood normal.    SH: no tobacco       Assessment & Plan:

## 2019-04-14 NOTE — Assessment & Plan Note (Signed)
Discussed his further weight gain and discussed diet modifications including avoiding juice and soda.

## 2019-04-23 ENCOUNTER — Other Ambulatory Visit: Payer: Self-pay

## 2019-04-23 ENCOUNTER — Ambulatory Visit: Payer: Self-pay

## 2019-05-21 ENCOUNTER — Other Ambulatory Visit: Payer: Self-pay

## 2019-05-21 ENCOUNTER — Ambulatory Visit: Payer: Self-pay | Attending: Nurse Practitioner | Admitting: Nurse Practitioner

## 2019-05-21 ENCOUNTER — Encounter: Payer: Self-pay | Admitting: Nurse Practitioner

## 2019-05-21 ENCOUNTER — Ambulatory Visit: Payer: Self-pay

## 2019-05-21 DIAGNOSIS — I10 Essential (primary) hypertension: Secondary | ICD-10-CM

## 2019-05-21 DIAGNOSIS — Z7689 Persons encountering health services in other specified circumstances: Secondary | ICD-10-CM

## 2019-05-21 DIAGNOSIS — E785 Hyperlipidemia, unspecified: Secondary | ICD-10-CM

## 2019-05-21 MED ORDER — PRAVASTATIN SODIUM 20 MG PO TABS: 20.0000 mg | ORAL_TABLET | Freq: Every day | ORAL | 3 refills | Status: DC

## 2019-05-21 MED ORDER — AMLODIPINE BESYLATE 5 MG PO TABS: 5.0000 mg | ORAL_TABLET | Freq: Every day | ORAL | 3 refills | Status: DC

## 2019-05-21 NOTE — Progress Notes (Signed)
Virtual Visit via Telephone Note Due to national recommendations of social distancing due to COVID 19, telehealth visit is felt to be most appropriate for this patient at this time.  I discussed the limitations, risks, security and privacy concerns of performing an evaluation and management service by telephone and the availability of in person appointments. I also discussed with the patient that there may be a patient responsible charge related to this service. The patient expressed understanding and agreed to proceed.    I connected with Edward Archer on 05/21/19  at   8:50 AM EST  EDT by telephone and verified that I am speaking with the correct person using two identifiers.   Consent I discussed the limitations, risks, security and privacy concerns of performing an evaluation and management service by telephone and the availability of in person appointments. I also discussed with the patient that there may be a patient responsible charge related to this service. The patient expressed understanding and agreed to proceed.   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  Seeing Dr. Luciana Axe with ID for HIV.  Essential Hypertension Will start amlodipine 5 mg today. He has never taken an antihypertensive. He does not monitor his blood pressure at home. Denies shortness of breath, palpitations, lightheadedness, dizziness, headaches or BLE edema. He does endorses intermittent chest pain. Unsure if GERD related symptoms or cardiac etiology. Recommended food diary when chest pain is  occurring and will review at next office visit with me to determine possible source of intermittent chest pain.  BP Readings from Last 3 Encounters:  04/14/19 (!) 163/108  09/28/18 134/88  03/11/18 (!) 157/105   Hyperlipidemia Patient presents for follow up to hyperlipidemia. He was prescribed pravastatin in the past however is no longer taking. Will restart today.  He is not consistently diet compliant and denies any previous statin intolerance including myalgias.  Lab Results  Component Value Date   CHOL 261 (H) 09/28/2018   Lab Results  Component Value Date   HDL 48 09/28/2018   Lab Results  Component Value Date   LDLCALC 186 (H) 09/28/2018   Lab Results  Component Value Date   TRIG 134 09/28/2018   Lab Results  Component Value Date   CHOLHDL 5.4 (H) 09/28/2018    Past Medical History:  Diagnosis Date  . CKD (chronic kidney disease) stage 1, GFR 90 ml/min or greater 09/09/2017  . HIV infection (HCC)   . Hyperlipidemia   . 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 NO PAST SURGERIES      Family History  Problem Relation Age of Onset  . Hypertension Mother   . Diabetes Sister   . Diabetes Maternal Grandmother   . Diabetes Paternal Grandmother     Social History   Socioeconomic History  . Marital status: Single    Spouse name: Not on file  . Number of children: Not on file  . Years of education: Not on file  . Highest education level: Not on file  Occupational History  . Occupation:  Unemployed  Tobacco Use  . Smoking status: Never Smoker  . Smokeless tobacco: Never Used  Substance and Sexual Activity  . Alcohol use: No  . Drug use: No  . Sexual activity: Yes    Partners: Female    Birth control/protection: Condom    Comment: pt. given condoms  Other Topics Concern  . Not on file  Social History Narrative   Recently discharge from [facility] July 27,2012.    Social Determinants of  Health   Financial Resource Strain:   . Difficulty of Paying Living Expenses: Not on file  Food Insecurity:   . Worried About Charity fundraiser in the Last Year: Not on file  . Ran Out of Food in the Last Year: Not on file  Transportation Needs:   . Lack of Transportation (Medical): Not on file  . Lack of Transportation (Non-Medical): Not on file  Physical Activity:   . Days of Exercise per Week: Not on file  . Minutes of Exercise per Session: Not on file  Stress:   . Feeling of Stress : Not on file  Social Connections:   . Frequency of Communication with Friends and Family: Not on file  . Frequency of Social Gatherings with Friends and Family: Not on file  . Attends Religious Services: Not on file  . Active Member of Clubs or Organizations: Not on file  . Attends Archivist Meetings: Not on file  . Marital Status: Not on file     Observations/Objective: Awake, alert and oriented x 3   Review of Systems  Constitutional: Negative for fever, malaise/fatigue and weight loss.  HENT: Negative.  Negative for nosebleeds.   Eyes: Negative.  Negative for blurred vision, double vision and photophobia.  Respiratory: Negative.  Negative for cough and shortness of breath.   Cardiovascular: Positive for chest pain. Negative for palpitations and leg swelling.  Gastrointestinal: Negative.  Negative for heartburn, nausea and vomiting.  Musculoskeletal: Negative.  Negative for myalgias.  Neurological: Negative.  Negative for dizziness, focal weakness, seizures and headaches.  Psychiatric/Behavioral: Negative.  Negative for suicidal ideas.    Assessment and Plan: Edward Archer was seen today for new patient (initial visit).  Diagnoses and all orders for this visit:  Encounter to establish care  Essential hypertension -     amLODipine (NORVASC) 5 MG tablet; Take 1 tablet (5 mg total) by mouth daily. F/u BP check in 2-3 weeks.   Dyslipidemia -     pravastatin (PRAVACHOL) 20 MG  tablet; Take 1 tablet (20 mg total) by mouth daily. INSTRUCTIONS: Work on a low fat, heart healthy diet and participate in regular aerobic exercise program by working out at least 150 minutes per week; 5 days a week-30 minutes per day. Avoid red meat/beef/steak,  fried foods. junk foods, sodas, sugary drinks, unhealthy snacking, alcohol and smoking.  Drink at least 80 oz of water per day and monitor your carbohydrate intake daily.      Follow Up Instructions Return in about 2 weeks (around 06/04/2019) for BP recheck with LUKE then see me in 3 months.     I discussed the assessment and treatment plan with the patient. The patient was provided an opportunity to ask questions and all were answered. The patient agreed with the plan and demonstrated an understanding of the instructions.   The patient was advised to call back or seek an in-person evaluation if the symptoms worsen or if the condition fails to improve as anticipated.  I provided 16  minutes of non-face-to-face time during this encounter including median intraservice time, reviewing previous notes, labs, imaging, medications and explaining diagnosis and management.  Kathren Scearce W Zhamir Pirro, FNP-BC 

## 2019-05-27 ENCOUNTER — Telehealth: Payer: Self-pay

## 2019-05-27 NOTE — Telephone Encounter (Signed)
Received approved prior authorization for patient's [employer]; contacted [employer] to notify of approval. They will register patient for [facility] co-pay card.   Sterling Mondo Loyola Mast, RN

## 2019-06-04 ENCOUNTER — Other Ambulatory Visit: Payer: Self-pay

## 2019-06-04 ENCOUNTER — Ambulatory Visit: Payer: Self-pay | Attending: Nurse Practitioner | Admitting: Pharmacist

## 2019-06-04 VITALS — BP 138/95 | HR 85

## 2019-06-04 DIAGNOSIS — I10 Essential (primary) hypertension: Secondary | ICD-10-CM

## 2019-06-04 NOTE — Progress Notes (Signed)
   S:    PCP: Zelda   Patient arrives in good spirits.  Presents to the clinic for hypertension evaluation, counseling, and management.  Patient was referred and last seen by Primary Care Provider on 05/21/2019.   Patient reports adherence with medications.  Current BP Medications include:  Amlodipine 5 mg daily  Dietary habits include: compliant with salt restriction; drinks green tea but only small amounts daily Exercise habits include: does some exercise but plans to increase amount of time performing aerobic exercise Family / Social history:  - Fhx: DM, HTN - Tobacco: never smoker - Alcohol: denies use  O:  Vitals:   06/04/19 1431  BP: (!) 138/95  Pulse: 85    Home BP readings: none   Last 3 Office BP readings: BP Readings from Last 3 Encounters:  06/04/19 (!) 138/95  04/14/19 (!) 163/108  09/28/18 134/88    BMET    Component Value Date/Time   NA 140 09/28/2018 1416   K 4.0 09/28/2018 1416   CL 105 09/28/2018 1416   CO2 27 09/28/2018 1416   GLUCOSE 101 (H) 09/28/2018 1416   BUN 18 09/28/2018 1416   CREATININE 1.34 09/28/2018 1416   CALCIUM 9.5 09/28/2018 1416   GFRNONAA 64 09/28/2018 1416   GFRAA 74 09/28/2018 1416    Renal function: CrCl cannot be calculated (Patient's most recent lab result is older than the maximum 21 days allowed.).  Clinical ASCVD: No  The 10-year ASCVD risk score Denman George DC Jr., et al., 2013) is: 8.6%   Values used to calculate the score:     Age: 44 years     Sex: Male     Is Non-Hispanic African American: Yes     Diabetic: No     Tobacco smoker: No     Systolic Blood Pressure: 138 mmHg     Is BP treated: Yes     HDL Cholesterol: 48 mg/dL     Total Cholesterol: 261 mg/dL   A/P: Hypertension longstanding currently above goal but improving on current medications. BP Goal = <130/80 mmHg. Patient is adherent with amlodipine. Before increasing the dose, he wishes to increase his level of aerobic exercise. He will do this and return  in ~1 month for BP recheck.  -Continued amlodipine 5 mg daily. -Counseled on lifestyle modifications for blood pressure control including reduced dietary sodium, increased exercise, adequate sleep  Results reviewed and written information provided.   Total time in face-to-face counseling 15 minutes.   F/U Clinic Visit in 3-4 weeks.    Butch Penny, PharmD, CPP Clinical Pharmacist [facility] [phone]

## 2019-06-25 ENCOUNTER — Other Ambulatory Visit: Payer: Self-pay

## 2019-06-25 ENCOUNTER — Ambulatory Visit: Payer: Self-pay | Attending: Nurse Practitioner | Admitting: Pharmacist

## 2019-06-25 VITALS — BP 154/101 | HR 77

## 2019-06-25 DIAGNOSIS — I10 Essential (primary) hypertension: Secondary | ICD-10-CM

## 2019-06-25 MED ORDER — AMLODIPINE BESYLATE 10 MG PO TABS: 10.0000 mg | ORAL_TABLET | Freq: Every day | ORAL | 1 refills | Status: DC

## 2019-06-25 NOTE — Progress Notes (Signed)
   S:    PCP: Zelda   Patient arrives in good spirits.  Presents to the clinic for hypertension evaluation, counseling, and management.  Patient was referred and last seen by Primary Care Provider on 05/21/2019.   Patient reports adherence with medications.  Current BP Medications include:  Amlodipine 5 mg daily  Dietary habits include: compliant with salt restriction; drinks green tea but only small amounts daily Exercise habits include: tries to get in 5000 steps/30 minute walks daily through the week as able. Family / Social history:  - Fhx: DM, HTN - Tobacco: never smoker - Alcohol: denies use  O:  Vitals:   06/25/19 0902  BP: (!) 154/101  Pulse: 77    Home BP readings:  -SBPs: 120s-160s -DBPs: 80s-90s  Last 3 Office BP readings: BP Readings from Last 3 Encounters:  06/25/19 (!) 154/101  06/04/19 (!) 138/95  04/14/19 (!) 163/108    BMET    Component Value Date/Time   NA 140 09/28/2018 1416   K 4.0 09/28/2018 1416   CL 105 09/28/2018 1416   CO2 27 09/28/2018 1416   GLUCOSE 101 (H) 09/28/2018 1416   BUN 18 09/28/2018 1416   CREATININE 1.34 09/28/2018 1416   CALCIUM 9.5 09/28/2018 1416   GFRNONAA 64 09/28/2018 1416   GFRAA 74 09/28/2018 1416    Renal function: CrCl cannot be calculated (Patient's most recent lab result is older than the maximum 21 days allowed.).  Clinical ASCVD: No  The 10-year ASCVD risk score Denman George DC Jr., et al., 2013) is: 10.5%   Values used to calculate the score:     Age: 44 years     Sex: Male     Is Non-Hispanic African American: Yes     Diabetic: No     Tobacco smoker: No     Systolic Blood Pressure: 154 mmHg     Is BP treated: Yes     HDL Cholesterol: 48 mg/dL     Total Cholesterol: 261 mg/dL   A/P: Hypertension longstanding currently above goal but improving on current medications. BP Goal = <130/80 mmHg. Patient is adherent with amlodipine. Will increase dose of amlodipine today.  -Increase amlodipine to 10 mg  daily. -Counseled on lifestyle modifications for blood pressure control including reduced dietary sodium, increased exercise, adequate sleep  Results reviewed and written information provided.   Total time in face-to-face counseling 15 minutes.   F/U Clinic Visit in April.   Butch Penny, PharmD, CPP Clinical Pharmacist [facility] [phone]

## 2019-08-17 ENCOUNTER — Other Ambulatory Visit: Payer: Self-pay | Admitting: Internal Medicine

## 2019-08-19 ENCOUNTER — Ambulatory Visit: Payer: Self-pay | Admitting: Nurse Practitioner

## 2019-08-20 ENCOUNTER — Ambulatory Visit: Payer: Self-pay | Admitting: Nurse Practitioner

## 2019-08-20 ENCOUNTER — Encounter: Payer: Self-pay | Admitting: Nurse Practitioner

## 2019-08-20 ENCOUNTER — Ambulatory Visit: Payer: Self-pay | Attending: Nurse Practitioner | Admitting: Nurse Practitioner

## 2019-08-20 ENCOUNTER — Other Ambulatory Visit: Payer: Self-pay

## 2019-08-20 VITALS — BP 114/77 | HR 86 | Temp 97.7°F | Ht 73.0 in | Wt 248.0 lb

## 2019-08-20 DIAGNOSIS — B20 Human immunodeficiency virus [HIV] disease: Secondary | ICD-10-CM

## 2019-08-20 DIAGNOSIS — I10 Essential (primary) hypertension: Secondary | ICD-10-CM

## 2019-08-20 DIAGNOSIS — I129 Hypertensive chronic kidney disease with stage 1 through stage 4 chronic kidney disease, or unspecified chronic kidney disease: Secondary | ICD-10-CM

## 2019-08-20 DIAGNOSIS — N181 Chronic kidney disease, stage 1: Secondary | ICD-10-CM

## 2019-08-20 DIAGNOSIS — E7800 Pure hypercholesterolemia, unspecified: Secondary | ICD-10-CM

## 2019-08-20 NOTE — Progress Notes (Signed)
Virtual Visit via Telephone Note Due to national recommendations of social distancing due to [place] 19, telehealth visit is felt to be most appropriate for this patient at this time.  I discussed the limitations, risks, security and privacy concerns of performing an evaluation and management service by telephone and the availability of in person appointments. I also discussed with the patient that there may be a patient responsible charge related to this service. The patient expressed understanding and agreed to proceed.    I connected with Edward Archer on 08/20/19  at  10:30 AM EDT  EDT by telephone and verified that I am speaking with the correct person using two identifiers.   Consent I discussed the limitations, risks, security and privacy concerns of performing an evaluation and management service by telephone and the availability of in person appointments. I also discussed with the patient that there may be a patient responsible charge related to this service. The patient expressed understanding and agreed to proceed.   Location of Patient: Private Residence    Location of Provider: [place] participating in Telemedicine visit: Geryl Rankins FNP-BC [place]    History of Present Illness: Telemedicine visit for: Follow Up  Dyslipidemia LDL not at goal of <100. Will refill pravastatin 20 mg. There is no history of statin intolerance. Diet is a factor in regard to elevated lipids.  Lab Results  Component Value Date   LDLCALC 186 (H) 09/28/2018   HIV Seeing Dr Linus Salmons with ID. Taking Odefsey 200-25-25 mg daily as prescribed.   Essential Hypertension Well controlled. Taking amlodipine 10 mg daily as prescribed. Denies chest pain, shortness of breath, palpitations, lightheadedness, dizziness, headaches or BLE edema.  BP Readings from Last 3 Encounters:  08/20/19 114/77  06/25/19 (!) 154/101  06/04/19 (!) 138/95   CKD CMP  pending. Not currently seeing a nephrologist.   Past Medical History:  Diagnosis Date  . CKD (chronic kidney disease) stage 1, GFR 90 ml/min or greater 09/09/2017  . HIV infection ([place])   . Hyperlipidemia   . 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 NO PAST SURGERIES      Family History  Problem Relation Age of Onset  . Hypertension Mother   . Diabetes Sister   . Diabetes Maternal Grandmother   . Diabetes Paternal Grandmother     Social History   Socioeconomic History  . Marital status: Single    Spouse name: Not on file  . Number of children: Not on file  . Years of education: Not on file  . Highest education level: Not on file  Occupational History  . Occupation: Unemployed  Tobacco Use  . Smoking status: Never Smoker  . Smokeless tobacco: Never Used  Substance and Sexual Activity  . Alcohol use: No  . Drug use: No  . Sexual activity: Yes    Partners: Female    Birth control/protection: Condom    Comment: pt. given condoms  Other Topics Concern  . Not on file  Social History Narrative   Recently discharge from [facility] July 27,2012.    Social Determinants of Health   Financial Resource Strain:   . Difficulty of Paying Living Expenses:   Food Insecurity:   . Worried About Charity fundraiser in the Last Year:   . Arboriculturist in the Last Year:   Transportation Needs:   .  Lack of Transportation (Medical):   Marland Kitchen Lack of Transportation (Non-Medical):   Physical Activity:   . Days of Exercise per Week:   . Minutes of Exercise per Session:   Stress:   . Feeling of Stress :   Social Connections:   . Frequency of Communication with Friends and Family:   . Frequency of Social Gatherings with Friends and Family:   . Attends Religious Services:   . Active Member of Clubs or Organizations:   . Attends Archivist Meetings:   Marland Kitchen Marital Status:      Observations/Objective: Awake,  alert and oriented x 3   ROS  Assessment and Plan: Edward Archer was seen today for follow-up.  Diagnoses and all orders for this visit:  Hypercholesterolemia -     Lipid panel  Human immunodeficiency virus (HIV) disease ([place]) -     Urine cytology ancillary only -     RPR  CKD (chronic kidney disease) stage 1, GFR 90 ml/min or greater -     CBC with Differential -     CMP14+EGFR     Follow Up Instructions Return in about 3 months (around 11/19/2019) for NEEDS ORANGE CARD/CAFA APP.     I discussed the assessment and treatment plan with the patient. The patient was provided an opportunity to ask questions and all were answered. The patient agreed with the plan and demonstrated an understanding of the instructions.   The patient was advised to call back or seek an in-person evaluation if the symptoms worsen or if the condition fails to improve as anticipated.  I provided 13 minutes of non-face-to-face time during this encounter including median intraservice time, reviewing previous notes, labs, imaging, medications and explaining diagnosis and management.  Gildardo Pounds, FNP-BC

## 2019-08-21 LAB — LIPID PANEL
Chol/HDL Ratio: 4.3 ratio (ref 0.0–5.0)
Cholesterol, Total: 241 mg/dL — ABNORMAL HIGH (ref 100–199)
HDL: 56 mg/dL (ref >39)
LDL Chol Calc (NIH): 167 mg/dL — ABNORMAL HIGH (ref 0–99)
Triglycerides: 104 mg/dL (ref 0–149)
VLDL Cholesterol Cal: 18 mg/dL (ref 5–40)

## 2019-08-21 LAB — CBC WITH DIFFERENTIAL/PLATELET
Basophils Absolute: 0.1 10*3/uL (ref 0.0–0.2)
Basos: 1 %
EOS (ABSOLUTE): 0.4 10*3/uL (ref 0.0–0.4)
Eos: 6 %
Hematocrit: 46.5 % (ref 37.5–51.0)
Hemoglobin: 15.3 g/dL (ref 13.0–17.7)
Immature Grans (Abs): 0 10*3/uL (ref 0.0–0.1)
Immature Granulocytes: 0 %
Lymphocytes Absolute: 2.2 10*3/uL (ref 0.7–3.1)
Lymphs: 37 %
MCH: 29.1 pg (ref 26.6–33.0)
MCHC: 32.9 g/dL (ref 31.5–35.7)
MCV: 89 fL (ref 79–97)
Monocytes Absolute: 0.4 10*3/uL (ref 0.1–0.9)
Monocytes: 7 %
Neutrophils Absolute: 2.9 10*3/uL (ref 1.4–7.0)
Neutrophils: 49 %
Platelets: 246 10*3/uL (ref 150–450)
RBC: 5.25 x10E6/uL (ref 4.14–5.80)
RDW: 13.5 % (ref 11.6–15.4)
WBC: 6 10*3/uL (ref 3.4–10.8)

## 2019-08-21 LAB — CMP14+EGFR
ALT: 33 IU/L (ref 0–44)
AST: 29 IU/L (ref 0–40)
Albumin/Globulin Ratio: 1.5 (ref 1.2–2.2)
Albumin: 4.3 g/dL (ref 4.0–5.0)
Alkaline Phosphatase: 72 IU/L (ref 39–117)
BUN/Creatinine Ratio: 12 (ref 9–20)
BUN: 16 mg/dL (ref 6–24)
Bilirubin Total: 0.4 mg/dL (ref 0.0–1.2)
CO2: 23 mmol/L (ref 20–29)
Calcium: 9.3 mg/dL (ref 8.7–10.2)
Chloride: 105 mmol/L (ref 96–106)
Creatinine, Ser: 1.36 mg/dL — ABNORMAL HIGH (ref 0.76–1.27)
GFR calc Af Amer: 72 mL/min/{1.73_m2} (ref >59)
GFR calc non Af Amer: 62 mL/min/{1.73_m2} (ref >59)
Globulin, Total: 2.8 g/dL (ref 1.5–4.5)
Glucose: 105 mg/dL — ABNORMAL HIGH (ref 65–99)
Potassium: 4.4 mmol/L (ref 3.5–5.2)
Sodium: 146 mmol/L — ABNORMAL HIGH (ref 134–144)
Total Protein: 7.1 g/dL (ref 6.0–8.5)

## 2019-08-21 LAB — RPR: RPR Ser Ql: NONREACTIVE

## 2019-08-23 ENCOUNTER — Encounter: Payer: Self-pay | Admitting: Internal Medicine

## 2019-08-25 LAB — URINE CYTOLOGY ANCILLARY ONLY
Bacterial Vaginitis-Urine: NEGATIVE
Candida Urine: NEGATIVE
Chlamydia: NEGATIVE
Comment: NEGATIVE
Comment: NEGATIVE
Comment: NORMAL
Neisseria Gonorrhea: NEGATIVE
Trichomonas: NEGATIVE

## 2019-09-20 ENCOUNTER — Ambulatory Visit: Payer: Self-pay

## 2019-09-29 ENCOUNTER — Ambulatory Visit: Payer: Self-pay

## 2019-09-29 DIAGNOSIS — Z21 Asymptomatic human immunodeficiency virus [HIV] infection status: Secondary | ICD-10-CM | POA: Insufficient documentation

## 2019-09-29 DIAGNOSIS — N181 Chronic kidney disease, stage 1: Secondary | ICD-10-CM | POA: Insufficient documentation

## 2019-09-29 DIAGNOSIS — Z79899 Other long term (current) drug therapy: Secondary | ICD-10-CM | POA: Insufficient documentation

## 2019-09-29 DIAGNOSIS — I129 Hypertensive chronic kidney disease with stage 1 through stage 4 chronic kidney disease, or unspecified chronic kidney disease: Secondary | ICD-10-CM | POA: Insufficient documentation

## 2019-09-29 DIAGNOSIS — N201 Calculus of ureter: Secondary | ICD-10-CM | POA: Insufficient documentation

## 2019-09-29 DIAGNOSIS — Z20822 Contact with and (suspected) exposure to covid-19: Secondary | ICD-10-CM | POA: Insufficient documentation

## 2019-09-30 ENCOUNTER — Encounter (HOSPITAL_COMMUNITY): Payer: Self-pay | Admitting: Emergency Medicine

## 2019-09-30 ENCOUNTER — Other Ambulatory Visit: Payer: Self-pay

## 2019-09-30 ENCOUNTER — Emergency Department (HOSPITAL_COMMUNITY)
Admission: EM | Admit: 2019-09-30 | Discharge: 2019-09-30 | Disposition: A | Discharge status: Home / Self Care | Payer: Self-pay | Attending: Emergency Medicine | Admitting: Emergency Medicine

## 2019-09-30 ENCOUNTER — Emergency Department (HOSPITAL_COMMUNITY): Payer: Self-pay

## 2019-09-30 DIAGNOSIS — N201 Calculus of ureter: Secondary | ICD-10-CM

## 2019-09-30 LAB — URINALYSIS, ROUTINE W REFLEX MICROSCOPIC
Bacteria, UA: NONE SEEN
Bilirubin Urine: NEGATIVE
Glucose, UA: NEGATIVE mg/dL
Ketones, ur: NEGATIVE mg/dL
Leukocytes,Ua: NEGATIVE
Nitrite: NEGATIVE
Protein, ur: NEGATIVE mg/dL
Specific Gravity, Urine: 1.021 (ref 1.005–1.030)
pH: 5 (ref 5.0–8.0)

## 2019-09-30 LAB — COMPREHENSIVE METABOLIC PANEL
ALT: 30 U/L (ref 0–44)
AST: 29 U/L (ref 15–41)
Albumin: 3.9 g/dL (ref 3.5–5.0)
Alkaline Phosphatase: 49 U/L (ref 38–126)
Anion gap: 10 (ref 5–15)
BUN: 24 mg/dL — ABNORMAL HIGH (ref 6–20)
CO2: 27 mmol/L (ref 22–32)
Calcium: 9.4 mg/dL (ref 8.9–10.3)
Chloride: 103 mmol/L (ref 98–111)
Creatinine, Ser: 2.17 mg/dL — ABNORMAL HIGH (ref 0.61–1.24)
GFR calc Af Amer: 41 mL/min — ABNORMAL LOW (ref >60)
GFR calc non Af Amer: 35 mL/min — ABNORMAL LOW (ref >60)
Glucose, Bld: 107 mg/dL — ABNORMAL HIGH (ref 70–99)
Potassium: 4.2 mmol/L (ref 3.5–5.1)
Sodium: 140 mmol/L (ref 135–145)
Total Bilirubin: 0.6 mg/dL (ref 0.3–1.2)
Total Protein: 7.3 g/dL (ref 6.5–8.1)

## 2019-09-30 LAB — CBC
HCT: 46.1 % (ref 39.0–52.0)
Hemoglobin: 14.4 g/dL (ref 13.0–17.0)
MCH: 29 pg (ref 26.0–34.0)
MCHC: 31.2 g/dL (ref 30.0–36.0)
MCV: 92.8 fL (ref 80.0–100.0)
Platelets: 236 10*3/uL (ref 150–400)
RBC: 4.97 MIL/uL (ref 4.22–5.81)
RDW: 13.2 % (ref 11.5–15.5)
WBC: 9.8 10*3/uL (ref 4.0–10.5)
nRBC: 0 % (ref 0.0–0.2)

## 2019-09-30 LAB — LIPASE, BLOOD: Lipase: 22 U/L (ref 11–51)

## 2019-09-30 LAB — SARS CORONAVIRUS 2 BY RT PCR (HOSPITAL ORDER, PERFORMED IN ~~LOC~~ HOSPITAL LAB): SARS Coronavirus 2: NEGATIVE

## 2019-09-30 MED ORDER — TAMSULOSIN HCL 0.4 MG PO CAPS: 0.4000 mg | ORAL_CAPSULE | Freq: Every day | ORAL | 0 refills | Status: AC

## 2019-09-30 MED ORDER — OXYCODONE-ACETAMINOPHEN 7.5-325 MG PO TABS: 1.0000 | ORAL_TABLET | Freq: Four times a day (QID) | ORAL | 0 refills | Status: DC | PRN

## 2019-09-30 MED ORDER — ONDANSETRON 4 MG PO TBDP
4.0000 mg | ORAL_TABLET | Freq: Three times a day (TID) | ORAL | 0 refills | Status: DC | PRN
Start: 2019-09-30 — End: 2020-11-22

## 2019-09-30 MED ORDER — SODIUM CHLORIDE 0.9% FLUSH: 3.0000 mL | Freq: Once | INTRAVENOUS | Status: DC

## 2019-09-30 MED ORDER — SODIUM CHLORIDE 0.9 % IV BOLUS
1000.0000 mL | Freq: Once | INTRAVENOUS | Status: AC
  Administered 2019-09-30: 1000 mL via INTRAVENOUS

## 2019-09-30 MED ORDER — TAMSULOSIN HCL 0.4 MG PO CAPS
0.4000 mg | ORAL_CAPSULE | Freq: Every day | ORAL | Status: DC
  Filled 2019-09-30: qty 1

## 2019-09-30 NOTE — ED Triage Notes (Signed)
Pt presents to ED POV. Pt c/o abd pain, groin pain, and back pain. No urinary complaints. Pt c/o constipation. Pt states that he took ibuprofen with no relief.

## 2019-09-30 NOTE — Discharge Instructions (Addendum)
You have been seen today for kidney stone. Please read and follow all provided instructions. Return to the emergency room for worsening condition or new concerning symptoms.    STOP taking Ibuprofen because it can damage your kidneys further.  1. Medications:  -Prescription sent to your pharmacy for Zofran. This is a nausea medicine. Please take as prescribed and if needed if you feel like you are going to vomit.  -Prescription sent to the pharmacy for Flomax. This is a medicine use to help dilate the tube your kidney stone is currently in.  -Prescription also sent to pharmacy for Percocet. This is for severe pain only. Do not take additional tylenol at the same time.    Continue usual home medications Take medications as prescribed. Please review all of the medicines and only take them if you do not have an allergy to them.   2. Treatment: rest, drink plenty of fluids  3. Follow Up:  Please follow up with the urologist. Call the office first thing this morning to schedule an appointment. When you call the office tell them you were seen in the emergency department and Dr. Retta Diones wants you to be seen in office today for follow up.   It is also a possibility that you have an allergic reaction to any of the medicines that you have been prescribed - Everybody reacts differently to medications and while MOST people have no trouble with most medicines, you may have a reaction such as nausea, vomiting, rash, swelling, shortness of breath. If this is the case, please stop taking the medicine immediately and contact your physician.  ?

## 2019-09-30 NOTE — ED Provider Notes (Addendum)
[facility] EMERGENCY DEPARTMENT Provider Note   CSN: [id number] Arrival date & time: 09/29/19  2356     History Chief Complaint  Patient presents with  . Back Pain  . Groin Pain  . Abdominal Pain    Edward Archer is a 44 y.o. male with past medical history significant for CKD stage I, HIV with undetectable viral load, hyperlipidemia presents to emergency department today with chief complaint of sudden onset of progressively worsening left lower quadrant abdominal pain.  He describes the pain as an aching sensation.  He is also endorsing 2 days of left lower back pain.  He states his back pain has been intermittent.  He rates the pain 10/10 in severity.  He took ibuprofen without any symptom relief.  He denies fever, chills, chest pain, shortness of breath, gross hematuria, urinary frequency, dysuria, testicular or scrotal pain and swelling, diarrhea or blood in stool.  He has history of right sided kidney stone in 2014.  He is followed by infectious disease here in [place]. He admits to having follow up appointment next month with ID.    Past Medical History:  Diagnosis Date  . CKD (chronic kidney disease) stage 1, GFR 90 ml/min or greater 09/09/2017  . HIV infection ([facility])   . Hyperlipidemia   . 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 Weight gain finding 03/11/2018  . CKD (chronic kidney disease) stage 1, GFR 90 ml/min or greater 09/09/2017  . Screening examination for venereal disease 03/04/2017  . Medication monitoring encounter 10/31/2016  . HTN (hypertension) 05/26/2014  . Seasonal allergies 12/30/2013  . Hyperglycemia 06/10/2013  . Hypercholesterolemia 03/26/2011  . Human immunodeficiency virus (HIV) disease ([facility]) 03/18/2011  . 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 NO PAST SURGERIES          Family History  Problem Relation Age of Onset  . Hypertension Mother   . Diabetes Sister   . Diabetes Maternal Grandmother   . Diabetes Paternal Grandmother     Social History   Tobacco Use  . Smoking status: Never Smoker  . Smokeless tobacco: Never Used  Substance Use Topics  . Alcohol use: No  . Drug use: No    Home Medications Prior to Admission medications   Medication Sig Start Date End Date Taking? Authorizing Provider  amLODipine (NORVASC) 10 MG tablet Take 1 tablet (10 mg total) by mouth daily. 06/25/19  Yes Hoy Register, MD  Multiple Vitamin (MULTIVITAMIN WITH MINERALS) TABS tablet Take 1 tablet by mouth daily.   Yes [provider]  naphazoline-glycerin (CLEAR EYES REDNESS) 0.012-0.2 % SOLN Place 2 drops into both eyes daily as needed for eye irritation.   Yes [provider]  ODEFSEY 200-25-25 MG TABS tablet TAKE 1 TABLET BY MOUTH DAILY WITH BREAKFAST Patient taking differently: Take 1 tablet by mouth daily.  08/17/19  Yes Comer, Belia Heman, MD  pravastatin (PRAVACHOL) 20 MG tablet Take 1 tablet (20 mg total) by mouth daily. 05/21/19  Yes Claiborne Rigg, NP  ondansetron (ZOFRAN ODT) 4 MG disintegrating tablet Take 1 tablet (4 mg total) by mouth every 8 (eight) hours as needed for nausea or vomiting. 09/30/19   Evah Rashid, Caroleen Hamman, PA-C  oxyCODONE-acetaminophen (PERCOCET) 7.5-325 MG tablet Take 1 tablet by mouth every 6 (six) hours as needed for  severe pain. 09/30/19   Shelita Steptoe E, PA-C  tamsulosin (FLOMAX) 0.4 MG CAPS capsule Take 1 capsule (0.4 mg total) by mouth daily after supper for 7 days. 09/30/19 10/07/19  Isidra Mings, Caroleen Hamman, PA-C    Allergies    Patient has no known allergies.  Review of Systems   Review of Systems All other systems are reviewed and are negative for acute change except as noted in the HPI.  Physical Exam Updated Vital Signs BP (!) 137/94   Pulse 72   Temp 97.7 F (36.5 C) (Oral)   Resp 20   Ht 6\' 1"   (1.854 m)   Wt 110.2 kg   SpO2 98%   BMI 32.06 kg/m   Physical Exam Vitals and nursing note reviewed.  Constitutional:      General: He is not in acute distress.    Appearance: He is not ill-appearing.  HENT:     Head: Normocephalic and atraumatic.     Right Ear: Tympanic membrane and external ear normal.     Left Ear: Tympanic membrane and external ear normal.     Nose: Nose normal.     Mouth/Throat:     Mouth: Mucous membranes are moist.     Pharynx: Oropharynx is clear.  Eyes:     General: No scleral icterus.       Right eye: No discharge.        Left eye: No discharge.     Extraocular Movements: Extraocular movements intact.     Conjunctiva/sclera: Conjunctivae normal.     Pupils: Pupils are equal, round, and reactive to light.  Neck:     Vascular: No JVD.  Cardiovascular:     Rate and Rhythm: Normal rate and regular rhythm.     Pulses: Normal pulses.          Radial pulses are 2+ on the right side and 2+ on the left side.     Heart sounds: Normal heart sounds.  Pulmonary:     Comments: Lungs clear to auscultation in all fields. Symmetric chest rise. No wheezing, rales, or rhonchi. Abdominal:     Palpations: Abdomen is soft. There is no mass.     Tenderness: There is no right CVA tenderness or left CVA tenderness.     Hernia: No hernia is present.     Comments: Abdomen is soft, non-distended, and non-tender in all quadrants. No rigidity, no guarding. No peritoneal signs.  Genitourinary:    Comments: Pt defers GU exam Musculoskeletal:        General: Normal range of motion.     Cervical back: Normal range of motion.  Skin:    General: Skin is warm and dry.     Capillary Refill: Capillary refill takes less than 2 seconds.  Neurological:     Mental Status: He is oriented to person, place, and time.     GCS: GCS eye subscore is 4. GCS verbal subscore is 5. GCS motor subscore is 6.     Comments: Fluent speech, no facial droop.  Psychiatric:        Behavior:  Behavior normal.     ED Results / Procedures / Treatments   Labs (all labs ordered are listed, but only abnormal results are displayed) Labs Reviewed  COMPREHENSIVE METABOLIC PANEL - Abnormal; Notable for the following components:      Result Value   Glucose, Bld 107 (*)    BUN 24 (*)    Creatinine, Ser 2.17 (*)  GFR calc non Af Amer 35 (*)    GFR calc Af Amer 41 (*)    All other components within normal limits  URINALYSIS, ROUTINE W REFLEX MICROSCOPIC - Abnormal; Notable for the following components:   Hgb urine dipstick MODERATE (*)    All other components within normal limits  SARS CORONAVIRUS 2 BY RT PCR [facility] ORDER, [place] LAB)  LIPASE, BLOOD  CBC    EKG EKG Interpretation  Date/Time:  Thursday Sep 30 2019 00:25:41 EDT Ventricular Rate:  76 PR Interval:  176 QRS Duration: 80 QT Interval:  382 QTC Calculation: 429 R Axis:   8 Text Interpretation: Normal sinus rhythm Normal ECG When compared with ECG of 07/05/2017, No significant change was found Confirmed by Delora Fuel ([id number]) on 09/30/2019 12:39:56 AM   Radiology CT Renal Stone Study  Result Date: 09/30/2019 CLINICAL DATA:  Left-sided flank pain that began yesterday EXAM: CT ABDOMEN AND PELVIS WITHOUT CONTRAST TECHNIQUE: Multidetector CT imaging of the abdomen and pelvis was performed following the standard protocol without IV contrast. COMPARISON:  09/19/2012 FINDINGS: Lower chest:  No contributory findings. Hepatobiliary: No focal liver abnormality.No evidence of biliary obstruction or stone. Pancreas: Unremarkable. Spleen: Unremarkable. Adrenals/Urinary Tract: Negative adrenals. 8 x 6 mm proximal left ureteral calculus with mild hydronephrosis. Smaller left lower pole calculus measuring 3 mm. Unremarkable bladder. Stomach/Bowel:  No obstruction. No appendicitis. Vascular/Lymphatic: No acute vascular abnormality. No mass or adenopathy. Reproductive:No pathologic findings. Other: No  ascites or pneumoperitoneum. Musculoskeletal: No acute abnormalities. IMPRESSION: 1. Left hydronephrosis from an 8 x 6 mm proximal ureteral stone. 2. 3 mm left renal calculus. Electronically Signed   By: Monte Fantasia M.D.   On: 09/30/2019 04:01    Procedures Procedures (including critical care time)  Medications Ordered in ED Medications  sodium chloride flush (NS) 0.9 % injection 3 mL (has no administration in time range)  tamsulosin (FLOMAX) capsule 0.4 mg (has no administration in time range)  sodium chloride 0.9 % bolus 1,000 mL (0 mLs Intravenous Stopped 09/30/19 6754)    ED Course  I have reviewed the triage vital signs and the nursing notes.  Pertinent labs & imaging results that were available during my care of the patient were reviewed by me and considered in my medical decision making (see chart for details).    MDM Rules/Calculators/A&P                      History provided by patient with additional history obtained from chart review.    Patient presents to the ED with complaints of abdominal pain. Patient nontoxic appearing, in no apparent distress, vitals WNL. On exam patient has no abdominal tenderness. He is asymptomatic on my exam although was noted to have pain level of 10 on arrival to ED. No peritoneal signs.  Labs were collected in triage.  I viewed results.  There is no leukocytosis, no anemia.  No severe electrolyte derangement, BUN/creatinine elevated at 24/2.17 prior to compare x1 month ago when it was 16/1.36.  GFR is 41.  Lipase is within normal range.  UA shows moderate hemoglobinuria with 6-10 RBCs.  No signs of urinary tract infection.  1 L IV fluids given.  Patient declines need for analgesics or antiemetics at this time. CT renal shows 8 x 6 mm proximal left ureteral calculus with mild hydronephrosis and a smaller left lower pole calculus measuring 3 mm.  Case discussed with on call urologist Dr. Diona Fanti who recommends  giving patient 1 liter fluids and DC  with Flomax and pain medications if pain is controlled. Patient will need to call office today to schedule close follow up appointment today. Patient reassessed after IV fluids. He is tolerating PO intake.  He continues to deny any pain. No abdominal tenderness on exam.  He is agreeable with plan of care.  Prescription given for Flomax, zofran, percocet. I have reviewed the PDMP during this encounter. Patient has no recent narcotic prescriptions.  The patient appears reasonably screened and/or stabilized for discharge and I doubt any other medical condition or other [facility] requiring further screening, evaluation, or treatment in the ED at this time prior to discharge. The patient is safe for discharge with strict return precautions discussed. Patient appears reliable for follow up. Findings and plan of care discussed with supervising physician Dr. Nicanor Alcon.   Portions of this note were generated with Scientist, clinical (histocompatibility and immunogenetics). Dictation errors may occur despite best attempts at proofreading.   Final Clinical Impression(s) / ED Diagnoses Final diagnoses:  Ureteral calculi    Rx / DC Orders ED Discharge Orders         Ordered    oxyCODONE-acetaminophen (PERCOCET) 7.5-325 MG tablet  Every 6 hours PRN     09/30/19 0618    ondansetron (ZOFRAN ODT) 4 MG disintegrating tablet  Every 8 hours PRN     09/30/19 0618    tamsulosin (FLOMAX) 0.4 MG CAPS capsule  Daily after supper     09/30/19 0618           Raylene Carmickle, Caroleen Hamman, PA-C 09/30/19 0705    Sherene Sires, PA-C 09/30/19 0707    Palumbo, April, MD 09/30/19 [phone]

## 2019-10-12 ENCOUNTER — Telehealth: Payer: Self-pay

## 2019-10-12 NOTE — Telephone Encounter (Signed)
COVID-19 Pre-Screening Questions:10/12/19  Do you currently have a fever (>100 F), chills or unexplained body aches?NO  Are you currently experiencing new cough, shortness of breath, sore throat, runny nose?NO .  Have you recently travelled outside the state of [place] in the last 14 days? NO .  Have you been in contact with someone that is currently pending confirmation of Covid19 testing or has been confirmed to have the Covid19 virus? NO  **If the patient answers NO to ALL questions -  advise the patient to please call the clinic before coming to the office should any symptoms develop.     

## 2019-10-13 ENCOUNTER — Other Ambulatory Visit: Payer: Self-pay

## 2019-10-13 DIAGNOSIS — B20 Human immunodeficiency virus [HIV] disease: Secondary | ICD-10-CM

## 2019-10-13 DIAGNOSIS — Z113 Encounter for screening for infections with a predominantly sexual mode of transmission: Secondary | ICD-10-CM

## 2019-10-13 DIAGNOSIS — E7800 Pure hypercholesterolemia, unspecified: Secondary | ICD-10-CM

## 2019-10-14 LAB — URINE CYTOLOGY ANCILLARY ONLY
Chlamydia: NEGATIVE
Comment: NEGATIVE
Comment: NORMAL
Neisseria Gonorrhea: NEGATIVE

## 2019-10-14 LAB — T-HELPER CELL (CD4) - (RCID CLINIC ONLY)
CD4 % Helper T Cell: 37 % (ref 33–65)
CD4 T Cell Abs: 893 /uL (ref 400–1790)

## 2019-10-15 LAB — COMPLETE METABOLIC PANEL WITH GFR
AG Ratio: 1.6 (calc) (ref 1.0–2.5)
ALT: 23 U/L (ref 9–46)
AST: 18 U/L (ref 10–40)
Albumin: 4 g/dL (ref 3.6–5.1)
Alkaline phosphatase (APISO): 57 U/L (ref 36–130)
BUN: 20 mg/dL (ref 7–25)
CO2: 25 mmol/L (ref 20–32)
Calcium: 9 mg/dL (ref 8.6–10.3)
Chloride: 107 mmol/L (ref 98–110)
Creat: 1.3 mg/dL (ref 0.60–1.35)
GFR, Est African American: 76 mL/min/{1.73_m2} (ref >=60)
GFR, Est Non African American: 66 mL/min/{1.73_m2} (ref >=60)
Globulin: 2.5 g/dL (calc) (ref 1.9–3.7)
Glucose, Bld: 127 mg/dL — ABNORMAL HIGH (ref 65–99)
Potassium: 4 mmol/L (ref 3.5–5.3)
Sodium: 140 mmol/L (ref 135–146)
Total Bilirubin: 0.3 mg/dL (ref 0.2–1.2)
Total Protein: 6.5 g/dL (ref 6.1–8.1)

## 2019-10-15 LAB — CBC WITH DIFFERENTIAL/PLATELET
Absolute Monocytes: 510 cells/uL (ref 200–950)
Basophils Absolute: 38 cells/uL (ref 0–200)
Basophils Relative: 0.6 %
Eosinophils Absolute: 158 cells/uL (ref 15–500)
Eosinophils Relative: 2.5 %
HCT: 45.3 % (ref 38.5–50.0)
Hemoglobin: 14.8 g/dL (ref 13.2–17.1)
Lymphs Abs: 2482 cells/uL (ref 850–3900)
MCH: 29.2 pg (ref 27.0–33.0)
MCHC: 32.7 g/dL (ref 32.0–36.0)
MCV: 89.5 fL (ref 80.0–100.0)
MPV: 10.8 fL (ref 7.5–12.5)
Monocytes Relative: 8.1 %
Neutro Abs: 3112 cells/uL (ref 1500–7800)
Neutrophils Relative %: 49.4 %
Platelets: 240 10*3/uL (ref 140–400)
RBC: 5.06 10*6/uL (ref 4.20–5.80)
RDW: 13 % (ref 11.0–15.0)
Total Lymphocyte: 39.4 %
WBC: 6.3 10*3/uL (ref 3.8–10.8)

## 2019-10-15 LAB — HIV-1 RNA QUANT-NO REFLEX-BLD
HIV 1 RNA Quant: <20 copies/mL — AB
HIV-1 RNA Quant, Log: <1.3 Log copies/mL — AB

## 2019-10-15 LAB — LIPID PANEL
Cholesterol: 219 mg/dL — ABNORMAL HIGH (ref <200)
HDL: 48 mg/dL (ref >=40)
LDL Cholesterol (Calc): 147 mg/dL (calc) — ABNORMAL HIGH
Non-HDL Cholesterol (Calc): 171 mg/dL (calc) — ABNORMAL HIGH (ref <130)
Total CHOL/HDL Ratio: 4.6 (calc) (ref <5.0)
Triglycerides: 120 mg/dL (ref <150)

## 2019-10-15 LAB — RPR: RPR Ser Ql: NONREACTIVE

## 2019-10-27 ENCOUNTER — Encounter: Payer: Self-pay | Admitting: Internal Medicine

## 2019-10-27 ENCOUNTER — Ambulatory Visit (INDEPENDENT_AMBULATORY_CARE_PROVIDER_SITE_OTHER): Payer: PRIVATE HEALTH INSURANCE | Admitting: Internal Medicine

## 2019-10-27 ENCOUNTER — Other Ambulatory Visit: Payer: Self-pay

## 2019-10-27 VITALS — BP 143/88 | HR 62 | Temp 98.0°F | Wt 239.0 lb

## 2019-10-27 DIAGNOSIS — Z23 Encounter for immunization: Secondary | ICD-10-CM | POA: Diagnosis not present

## 2019-10-27 DIAGNOSIS — B20 Human immunodeficiency virus [HIV] disease: Secondary | ICD-10-CM | POA: Diagnosis not present

## 2019-10-27 DIAGNOSIS — Z113 Encounter for screening for infections with a predominantly sexual mode of transmission: Secondary | ICD-10-CM

## 2019-10-27 DIAGNOSIS — I10 Essential (primary) hypertension: Secondary | ICD-10-CM

## 2019-10-27 DIAGNOSIS — Z5181 Encounter for therapeutic drug level monitoring: Secondary | ICD-10-CM

## 2019-10-27 NOTE — Assessment & Plan Note (Signed)
Will do #2 today and #3 next visit.

## 2019-10-27 NOTE — Assessment & Plan Note (Signed)
Now on BP medication and improved.  Followed now by primary care.
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
## 2019-10-27 NOTE — Progress Notes (Signed)
   Subjective:    Patient ID: Edward Archer, male    DOB: 03-Oct-1975, 44 y.o.   MRN: [id number]  HPI Here for follow up of HIV He continues on Odefsey and no missed doses.  CD4 893,  Viral load < 20.  Had a kidney stone recently.  Now established in primary care and on medication for HTN.  He has received his first dose of the COVID vaccine and due on the 21st for #2.  Has not had COVID to his knowledge.  Has some chronic left ear discomfort.    Review of Systems  Constitutional: Negative for fever.  Gastrointestinal: Negative for diarrhea and nausea.  Skin: Negative for rash.       Objective:   Physical Exam Constitutional:      Appearance: Normal appearance. He is well-developed.  Eyes:     General: No scleral icterus. Cardiovascular:     Rate and Rhythm: Normal rate and regular rhythm.     Heart sounds: Normal heart sounds.  Pulmonary:     Effort: Pulmonary effort is normal.     Breath sounds: Normal breath sounds.  Skin:    Findings: No rash.  Neurological:     General: No focal deficit present.     Mental Status: He is alert.  Psychiatric:        Mood and Affect: Mood normal.    SH: no tobacco       Assessment & Plan:

## 2019-10-27 NOTE — Assessment & Plan Note (Signed)
Screened negative

## 2019-10-27 NOTE — Assessment & Plan Note (Signed)
Lipid panel noted and he is now on a statin.

## 2019-11-13 ENCOUNTER — Other Ambulatory Visit: Payer: Self-pay

## 2019-11-13 ENCOUNTER — Emergency Department (HOSPITAL_COMMUNITY): Payer: PRIVATE HEALTH INSURANCE

## 2019-11-13 ENCOUNTER — Emergency Department (HOSPITAL_COMMUNITY)
Admission: EM | Admit: 2019-11-13 | Discharge: 2019-11-13 | Disposition: A | Discharge status: Home / Self Care | Payer: PRIVATE HEALTH INSURANCE | Attending: Emergency Medicine | Admitting: Emergency Medicine

## 2019-11-13 ENCOUNTER — Encounter (HOSPITAL_COMMUNITY): Payer: Self-pay | Admitting: Emergency Medicine

## 2019-11-13 DIAGNOSIS — M25562 Pain in left knee: Secondary | ICD-10-CM | POA: Insufficient documentation

## 2019-11-13 DIAGNOSIS — M545 Low back pain: Secondary | ICD-10-CM | POA: Diagnosis not present

## 2019-11-13 DIAGNOSIS — R0781 Pleurodynia: Secondary | ICD-10-CM | POA: Diagnosis not present

## 2019-11-13 DIAGNOSIS — R079 Chest pain, unspecified: Secondary | ICD-10-CM | POA: Diagnosis present

## 2019-11-13 MED ORDER — ACETAMINOPHEN 325 MG PO TABS
650.0000 mg | ORAL_TABLET | Freq: Once | ORAL | Status: AC
  Administered 2019-11-13: 650 mg via ORAL
  Filled 2019-11-13: qty 2

## 2019-11-13 MED ORDER — LIDOCAINE 5 % EX PTCH
1.0000 | MEDICATED_PATCH | Freq: Once | CUTANEOUS | Status: DC
  Administered 2019-11-13: 1 via TRANSDERMAL
  Filled 2019-11-13: qty 1

## 2019-11-13 MED ORDER — IBUPROFEN 200 MG PO TABS
600.0000 mg | ORAL_TABLET | Freq: Once | ORAL | Status: AC
  Administered 2019-11-13: 600 mg via ORAL
  Filled 2019-11-13: qty 3

## 2019-11-13 MED ORDER — CYCLOBENZAPRINE HCL 10 MG PO TABS
10.0000 mg | ORAL_TABLET | Freq: Two times a day (BID) | ORAL | 0 refills | Status: DC | PRN
Start: 2019-11-13 — End: 2020-04-26
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
## 2019-11-16 ENCOUNTER — Ambulatory Visit: Payer: PRIVATE HEALTH INSURANCE

## 2019-12-22 ENCOUNTER — Other Ambulatory Visit: Payer: Self-pay | Admitting: Family Medicine

## 2019-12-22 NOTE — Telephone Encounter (Signed)
Requested Prescriptions  Pending Prescriptions Disp Refills   amLODipine (NORVASC) 10 MG tablet [Pharmacy Med Name: AMLODIPINE BESYLATE 10MG  TABLETS] 90 tablet 1    Sig: TAKE 1 TABLET(10 MG) BY MOUTH DAILY     Cardiovascular:  Calcium Channel Blockers Failed - 12/22/2019  6:25 AM      Failed - Last BP in normal range    BP Readings from Last 1 Encounters:  11/13/19 (!) 152/97         Passed - Valid encounter within last 6 months    Recent Outpatient Visits          4 months ago Essential hypertension   [facility] [place], [place], NP   6 months ago Essential hypertension   [facility], Lois Huxley, RPH-CPP   6 months ago Essential hypertension   [facility], Lois Huxley, RPH-CPP   7 months ago Encounter to establish care   [facility] [place], [place], NP

## 2020-02-04 ENCOUNTER — Encounter: Payer: Self-pay | Admitting: Internal Medicine

## 2020-03-21 ENCOUNTER — Other Ambulatory Visit: Payer: Self-pay | Admitting: Nurse Practitioner

## 2020-03-21 NOTE — Telephone Encounter (Signed)
Requested Prescriptions  Pending Prescriptions Disp Refills   amLODipine (NORVASC) 10 MG tablet [Pharmacy Med Name: AMLODIPINE BESYLATE 10MG  TABLETS] 30 tablet 0    Sig: TAKE 1 TABLET(10 MG) BY MOUTH DAILY     Cardiovascular:  Calcium Channel Blockers Failed - 03/21/2020  6:23 AM      Failed - Last BP in normal range    BP Readings from Last 1 Encounters:  11/13/19 (!) 152/97         Failed - Valid encounter within last 6 months    Recent Outpatient Visits          7 months ago Essential hypertension   [facility] [place], [place], NP   9 months ago Essential hypertension   [facility], Lois Huxley, RPH-CPP   9 months ago Essential hypertension   [facility], RPH-CPP   10 months ago Encounter to establish care   [facility] [place], [place], NP             One month courtesy refill with reminder for patient to call office to schedule a follow-up appointment.

## 2020-04-12 ENCOUNTER — Other Ambulatory Visit: Payer: PRIVATE HEALTH INSURANCE

## 2020-04-12 ENCOUNTER — Other Ambulatory Visit: Payer: Self-pay

## 2020-04-12 DIAGNOSIS — B20 Human immunodeficiency virus [HIV] disease: Secondary | ICD-10-CM

## 2020-04-13 LAB — T-HELPER CELL (CD4) - (RCID CLINIC ONLY)
CD4 % Helper T Cell: 37 % (ref 33–65)
CD4 T Cell Abs: 817 /uL (ref 400–1790)

## 2020-04-14 LAB — HIV-1 RNA QUANT-NO REFLEX-BLD
HIV 1 RNA Quant: <20 Copies/mL — ABNORMAL HIGH
HIV-1 RNA Quant, Log: <1.3 Log cps/mL — ABNORMAL HIGH

## 2020-04-20 ENCOUNTER — Other Ambulatory Visit: Payer: Self-pay | Admitting: Nurse Practitioner

## 2020-04-26 ENCOUNTER — Ambulatory Visit (INDEPENDENT_AMBULATORY_CARE_PROVIDER_SITE_OTHER): Payer: PRIVATE HEALTH INSURANCE | Admitting: Internal Medicine

## 2020-04-26 ENCOUNTER — Telehealth: Payer: Self-pay

## 2020-04-26 ENCOUNTER — Encounter: Payer: Self-pay | Admitting: Internal Medicine

## 2020-04-26 ENCOUNTER — Other Ambulatory Visit: Payer: Self-pay

## 2020-04-26 VITALS — BP 134/87 | HR 76 | Temp 98.1°F | Wt 252.0 lb

## 2020-04-26 DIAGNOSIS — Z113 Encounter for screening for infections with a predominantly sexual mode of transmission: Secondary | ICD-10-CM | POA: Diagnosis not present

## 2020-04-26 DIAGNOSIS — B20 Human immunodeficiency virus [HIV] disease: Secondary | ICD-10-CM

## 2020-04-26 DIAGNOSIS — E7800 Pure hypercholesterolemia, unspecified: Secondary | ICD-10-CM

## 2020-04-26 DIAGNOSIS — Z23 Encounter for immunization: Secondary | ICD-10-CM | POA: Diagnosis not present

## 2020-04-26 MED ORDER — ODEFSEY 200-25-25 MG PO TABS: 1.0000 | ORAL_TABLET | Freq: Every day | ORAL | 11 refills | Status: DC

## 2020-04-26 NOTE — Progress Notes (Signed)
   Subjective:    Patient ID: Edward Archer, male    DOB: [birth year], 44 y.o.   MRN: [id number]  HPI Here for follow up of HIV He continues on Odefesy and no missed doses.  He has run out of refills but has had enough left over to continue.  No new issues.  CD4 817 and viral load < 20.     Review of Systems  Constitutional: Negative for fatigue.  Gastrointestinal: Negative for diarrhea and nausea.  Skin: Negative for rash.       Objective:   Physical Exam Constitutional:      Appearance: He is well-developed.  Eyes:     General: No scleral icterus. Pulmonary:     Effort: Pulmonary effort is normal.  Skin:    Findings: No rash.  Neurological:     General: No focal deficit present.     Mental Status: He is alert.  Psychiatric:        Mood and Affect: Mood normal.          Assessment & Plan:

## 2020-04-26 NOTE — Assessment & Plan Note (Signed)
He continues to do well, no changes and rtc in 6 months.

## 2020-04-26 NOTE — Assessment & Plan Note (Signed)
Flu shot done today He will go get the COVID booster, had the second one in June (erroneously listed as October).

## 2020-04-26 NOTE — Telephone Encounter (Signed)
I will reach out to the patient and see if I can sign him up for PAF.      Clearance Coots, CPhT Specialty Pharmacy Patient [facility] for Infectious Disease Phone: [phone] Fax:  [phone]

## 2020-04-26 NOTE — Telephone Encounter (Signed)
[facility] Patient Advocate Encounter   Was successful in obtaining a [employer] copay card for Biktarvy.  This copay card will make the patients copay $0.00.  I have spoken with the patient.         Ruxin Ransome, CPhT Specialty Pharmacy Patient [facility] Phone: [phone] Fax:  [phone]  

## 2020-04-26 NOTE — Telephone Encounter (Signed)
CMA called [facility] to follow up on concerns regarding medication refill. Patient stated during today's visit that pharmacy is not able to refill medication due to insurance issues. Patient is approved for [employer] plan.  Spoke with Trinna Post at [employer] who states patient's copay coupon is maxed out. Insurance did cover medication, but patient will need to pay $30 copay. Patient has paid this for past 3 refills. Did not have to pay a copay in the past. Would like to know if there is any additional assistance to cover the $30.  Will forward message to pharmacy team for copay assistance. Edward Archer, [place]

## 2020-06-12 ENCOUNTER — Ambulatory Visit: Payer: No Typology Code available for payment source | Attending: Nurse Practitioner | Admitting: Nurse Practitioner

## 2020-06-12 ENCOUNTER — Other Ambulatory Visit: Payer: Self-pay

## 2020-06-19 ENCOUNTER — Other Ambulatory Visit: Payer: Self-pay | Admitting: Nurse Practitioner

## 2020-06-19 DIAGNOSIS — E785 Hyperlipidemia, unspecified: Secondary | ICD-10-CM

## 2020-06-19 NOTE — Telephone Encounter (Signed)
Requested medication (s) are due for refill today:  yes  Requested medication (s) are on the active medication list: yes  Last refill: 03/21/2020  Future visit scheduled: No  Notes to clinic:  overdue for follow up appt No show last appt    Requested Prescriptions  Pending Prescriptions Disp Refills   pravastatin (PRAVACHOL) 20 MG tablet [Pharmacy Med Name: PRAVASTATIN 20MG  TABLETS] 90 tablet 3    Sig: TAKE 1 TABLET(20 MG) BY MOUTH DAILY      Cardiovascular:  Antilipid - Statins Failed - 06/19/2020  6:21 AM      Failed - Total Cholesterol in normal range and within 360 days    Cholesterol, Total  Date Value Ref Range Status  08/20/2019 241 (H) 100 - 199 mg/dL Final   Cholesterol  Date Value Ref Range Status  10/13/2019 219 (H) <200 mg/dL Final          Failed - LDL in normal range and within 360 days    LDL Cholesterol (Calc)  Date Value Ref Range Status  10/13/2019 147 (H) mg/dL (calc) Final    Comment:    Reference range: <100 . Desirable range <100 mg/dL for primary prevention;   <70 mg/dL for patients with CHD or diabetic patients  with > or = 2 CHD risk factors. 12/13/2019 LDL-C is now calculated using the Martin-Hopkins  calculation, which is a validated novel method providing  better accuracy than the Friedewald equation in the  estimation of LDL-C.  Marland Kitchen et al. Horald Pollen. Lenox Ahr): 2061-2068  (http://education.[employer].com/faq/FAQ164)           Passed - HDL in normal range and within 360 days    HDL  Date Value Ref Range Status  10/13/2019 48 > OR = 40 mg/dL Final  12/13/2019 56 78/29/5621 mg/dL Final          Passed - Triglycerides in normal range and within 360 days    Triglycerides  Date Value Ref Range Status  10/13/2019 120 <150 mg/dL Final          Passed - Patient is not pregnant      Passed - Valid encounter within last 12 months    Recent Outpatient Visits           10 months ago Essential hypertension   [facility] [place], [place], NP   12 months ago Essential hypertension   [facility], Lois Huxley, RPH-CPP   1 year ago Essential hypertension   [facility] [place], Lois Huxley, RPH-CPP   1 year ago Encounter to establish care   [facility] [place], [place] W, NP                  amLODipine (NORVASC) 10 MG tablet [Pharmacy Med Name: AMLODIPINE BESYLATE 10MG  TABLETS] 30 tablet 1    Sig: TAKE 1 TABLET(10 MG) BY MOUTH DAILY      Cardiovascular:  Calcium Channel Blockers Failed - 06/19/2020  6:21 AM      Failed - Valid encounter within last 6 months    Recent Outpatient Visits           10 months ago Essential hypertension   [facility] 08/17/2020, NP   12 months ago Essential hypertension   [facility] [place], [facility], RPH-CPP   1 year ago Essential hypertension  [facility] [place] Huxley, [place] Moras, RPH-CPP   1 year ago Encounter to establish care   [facility] [place], [place] W, NP                Passed - Last BP in normal range    BP Readings from Last 1 Encounters:  04/26/20 134/87

## 2020-07-06 ENCOUNTER — Other Ambulatory Visit: Payer: Self-pay | Admitting: Nurse Practitioner

## 2020-07-06 DIAGNOSIS — I10 Essential (primary) hypertension: Secondary | ICD-10-CM

## 2020-07-28 ENCOUNTER — Ambulatory Visit: Payer: Self-pay

## 2020-07-28 ENCOUNTER — Other Ambulatory Visit: Payer: Self-pay

## 2020-08-31 ENCOUNTER — Encounter: Payer: Self-pay | Admitting: Internal Medicine

## 2020-11-01 ENCOUNTER — Other Ambulatory Visit: Payer: Self-pay

## 2020-11-01 DIAGNOSIS — B20 Human immunodeficiency virus [HIV] disease: Secondary | ICD-10-CM

## 2020-11-01 DIAGNOSIS — E7800 Pure hypercholesterolemia, unspecified: Secondary | ICD-10-CM

## 2020-11-01 DIAGNOSIS — Z113 Encounter for screening for infections with a predominantly sexual mode of transmission: Secondary | ICD-10-CM

## 2020-11-02 LAB — T-HELPER CELL (CD4) - (RCID CLINIC ONLY)
CD4 % Helper T Cell: 39 % (ref 33–65)
CD4 T Cell Abs: 869 /uL (ref 400–1790)

## 2020-11-02 LAB — URINE CYTOLOGY ANCILLARY ONLY
Chlamydia: NEGATIVE
Comment: NEGATIVE
Comment: NORMAL
Neisseria Gonorrhea: NEGATIVE

## 2020-11-03 LAB — COMPLETE METABOLIC PANEL WITH GFR
AG Ratio: 1.5 (calc) (ref 1.0–2.5)
ALT: 40 U/L (ref 9–46)
AST: 25 U/L (ref 10–40)
Albumin: 4.1 g/dL (ref 3.6–5.1)
Alkaline phosphatase (APISO): 56 U/L (ref 36–130)
BUN: 13 mg/dL (ref 7–25)
CO2: 25 mmol/L (ref 20–32)
Calcium: 8.8 mg/dL (ref 8.6–10.3)
Chloride: 105 mmol/L (ref 98–110)
Creat: 1.17 mg/dL (ref 0.60–1.35)
GFR, Est African American: 86 mL/min/{1.73_m2} (ref >=60)
GFR, Est Non African American: 74 mL/min/{1.73_m2} (ref >=60)
Globulin: 2.7 g/dL (calc) (ref 1.9–3.7)
Glucose, Bld: 121 mg/dL — ABNORMAL HIGH (ref 65–99)
Potassium: 4 mmol/L (ref 3.5–5.3)
Sodium: 139 mmol/L (ref 135–146)
Total Bilirubin: 0.4 mg/dL (ref 0.2–1.2)
Total Protein: 6.8 g/dL (ref 6.1–8.1)

## 2020-11-03 LAB — CBC WITH DIFFERENTIAL/PLATELET
Absolute Monocytes: 512 cells/uL (ref 200–950)
Basophils Absolute: 31 cells/uL (ref 0–200)
Basophils Relative: 0.5 %
Eosinophils Absolute: 79 cells/uL (ref 15–500)
Eosinophils Relative: 1.3 %
HCT: 44.9 % (ref 38.5–50.0)
Hemoglobin: 15 g/dL (ref 13.2–17.1)
Lymphs Abs: 2416 cells/uL (ref 850–3900)
MCH: 29 pg (ref 27.0–33.0)
MCHC: 33.4 g/dL (ref 32.0–36.0)
MCV: 86.8 fL (ref 80.0–100.0)
MPV: 10.4 fL (ref 7.5–12.5)
Monocytes Relative: 8.4 %
Neutro Abs: 3062 cells/uL (ref 1500–7800)
Neutrophils Relative %: 50.2 %
Platelets: 251 10*3/uL (ref 140–400)
RBC: 5.17 10*6/uL (ref 4.20–5.80)
RDW: 13 % (ref 11.0–15.0)
Total Lymphocyte: 39.6 %
WBC: 6.1 10*3/uL (ref 3.8–10.8)

## 2020-11-03 LAB — LIPID PANEL
Cholesterol: 228 mg/dL — ABNORMAL HIGH (ref <200)
HDL: 52 mg/dL (ref >=40)
LDL Cholesterol (Calc): 154 mg/dL (calc) — ABNORMAL HIGH
Non-HDL Cholesterol (Calc): 176 mg/dL (calc) — ABNORMAL HIGH (ref <130)
Total CHOL/HDL Ratio: 4.4 (calc) (ref <5.0)
Triglycerides: 106 mg/dL (ref <150)

## 2020-11-03 LAB — HIV-1 RNA QUANT-NO REFLEX-BLD
HIV 1 RNA Quant: <20 Copies/mL — ABNORMAL HIGH
HIV-1 RNA Quant, Log: <1.3 Log cps/mL — ABNORMAL HIGH

## 2020-11-03 LAB — RPR: RPR Ser Ql: NONREACTIVE

## 2020-11-15 ENCOUNTER — Encounter: Payer: PRIVATE HEALTH INSURANCE | Admitting: Internal Medicine

## 2020-11-22 ENCOUNTER — Encounter: Payer: Self-pay | Admitting: Internal Medicine

## 2020-11-22 ENCOUNTER — Ambulatory Visit: Payer: Self-pay

## 2020-11-22 ENCOUNTER — Other Ambulatory Visit: Payer: Self-pay

## 2020-11-22 ENCOUNTER — Ambulatory Visit (INDEPENDENT_AMBULATORY_CARE_PROVIDER_SITE_OTHER): Payer: Self-pay | Admitting: Internal Medicine

## 2020-11-22 VITALS — BP 143/93 | HR 88 | Temp 97.3°F | Wt 250.0 lb

## 2020-11-22 DIAGNOSIS — N181 Chronic kidney disease, stage 1: Secondary | ICD-10-CM

## 2020-11-22 DIAGNOSIS — Z113 Encounter for screening for infections with a predominantly sexual mode of transmission: Secondary | ICD-10-CM

## 2020-11-22 DIAGNOSIS — R739 Hyperglycemia, unspecified: Secondary | ICD-10-CM

## 2020-11-22 DIAGNOSIS — R635 Abnormal weight gain: Secondary | ICD-10-CM

## 2020-11-22 DIAGNOSIS — B20 Human immunodeficiency virus [HIV] disease: Secondary | ICD-10-CM

## 2020-11-22 MED ORDER — ODEFSEY 200-25-25 MG PO TABS: 1.0000 | ORAL_TABLET | Freq: Every day | ORAL | 11 refills | Status: DC

## 2020-11-22 NOTE — Assessment & Plan Note (Signed)
I discussed with him again his mildly elevated non-fasting blood sugar and he will continue his efforts at weight loss and reduced sugar intake.   He will further discuss this with his PCP.

## 2020-11-22 NOTE — Assessment & Plan Note (Signed)
He has gained some weight and interested in weight loss I discussed reducing sugary beverages, excercising more and reducing carbs.

## 2020-11-22 NOTE — Assessment & Plan Note (Signed)
His creat has remained normal, no new concerns.

## 2020-11-22 NOTE — Assessment & Plan Note (Signed)
Screened negative 

## 2020-11-22 NOTE — Assessment & Plan Note (Signed)
He continues to do well with no concerns and no changes indicated.  rtc in 6 months.

## 2020-11-22 NOTE — Progress Notes (Signed)
   Subjective:    Patient ID: Edward Archer, male    DOB: 10-14-1975, 45 y.o.   MRN: [id number]  HPI He is here for follow up of HIV He continues on [facility] and denies missed doses.  He has no problems getting, taking or tolerating his medications.  CD4 is 869, viral load < 20.  Creat, LFTs wnl.  His blood sugar remains mildly elevated, BP high at 143/93, though he did not take his medication this am.  He has gained some weight.     Review of Systems  Constitutional:  Negative for fatigue.  Gastrointestinal:  Negative for diarrhea and nausea.  Skin:  Negative for rash.      Objective:   Physical Exam Eyes:     General: No scleral icterus. Cardiovascular:     Rate and Rhythm: Normal rate and regular rhythm.  Pulmonary:     Effort: Pulmonary effort is normal.  Skin:    Findings: No rash.  Neurological:     General: No focal deficit present.     Mental Status: He is alert.   SH: no tobacco       Assessment & Plan:

## 2020-11-22 NOTE — Addendum Note (Signed)
Addended by: Gardiner Barefoot on: 11/22/2020 09:45 AM   Modules accepted: Orders

## 2021-05-05 ENCOUNTER — Other Ambulatory Visit: Payer: Self-pay | Admitting: Internal Medicine

## 2021-05-25 ENCOUNTER — Ambulatory Visit: Payer: Self-pay

## 2021-05-29 ENCOUNTER — Other Ambulatory Visit (HOSPITAL_COMMUNITY): Payer: Self-pay

## 2021-05-29 ENCOUNTER — Ambulatory Visit: Payer: BC Managed Care – PPO

## 2021-05-29 ENCOUNTER — Other Ambulatory Visit: Payer: BC Managed Care – PPO

## 2021-05-29 ENCOUNTER — Telehealth: Payer: Self-pay

## 2021-05-29 ENCOUNTER — Other Ambulatory Visit: Payer: Self-pay

## 2021-05-29 DIAGNOSIS — B20 Human immunodeficiency virus [HIV] disease: Secondary | ICD-10-CM

## 2021-05-29 NOTE — Telephone Encounter (Signed)
[facility] Patient Advocate Encounter   Received notification from [employer] that prior authorization for Edward Archer is required.   PA submitted on 05/29/2021 Key [id number] Status is pending  To check PA status go to [email or web address]    [facility] will continue to follow.   Edward Archer, CPhT Specialty Pharmacy Patient [facility] for Infectious Disease Phone: [phone] Fax:  [phone]

## 2021-05-30 ENCOUNTER — Other Ambulatory Visit (HOSPITAL_COMMUNITY): Payer: Self-pay

## 2021-05-30 ENCOUNTER — Telehealth: Payer: Self-pay

## 2021-05-30 LAB — T-HELPER CELL (CD4) - (RCID CLINIC ONLY)
CD4 % Helper T Cell: 36 % (ref 33–65)
CD4 T Cell Abs: 724 /uL (ref 400–1790)

## 2021-05-30 NOTE — Telephone Encounter (Signed)
[facility] Patient Advocate Encounter  Prior Authorization for Edward Archer has been approved.    PA# [id number] Effective dates: 05/30/21 through 05/29/22  Patients co-pay is $0.00.   Patient is aware.  [facility] will continue to follow.  Clearance Coots, CPhT Specialty Pharmacy Patient [name] for Infectious Disease Phone: [phone] Fax:  [phone] [facility]

## 2021-05-31 LAB — HIV-1 RNA QUANT-NO REFLEX-BLD
HIV 1 RNA Quant: 31 Copies/mL — ABNORMAL HIGH
HIV-1 RNA Quant, Log: 1.49 Log cps/mL — ABNORMAL HIGH

## 2021-06-20 ENCOUNTER — Ambulatory Visit: Payer: Self-pay

## 2021-06-20 NOTE — Telephone Encounter (Signed)
Summary: BP concern   The patient feels that their blood pressure is elevated   The patient's BP is currently 145/93 taken at 8:38 AM today 06/20/21   The patient would like to discuss further when possible      Chief Complaint: Elevated BP Symptoms: Sometimes has headaches Frequency: Has been out of medication "for awhile." Pertinent Negatives: Patient denies chest pain Disposition: [] ED /[] Urgent Care (no appt availability in office) / [] Appointment(In office/virtual)/ []  [facility]/ [] Home Care/ [] Refused Recommended Disposition /[] [facility]/ []  Follow-up with PCP Additional Notes: Declines mobile unit. Would like to be worked in before March appointment.  Answer Assessment - Initial Assessment Questions 1. BLOOD PRESSURE: "What is the blood pressure?" "Did you take at least two measurements 5 minutes apart?"     145/93 2. ONSET: "When did you take your blood pressure?"     Today 3. HOW: "How did you obtain the blood pressure?" (e.g., visiting nurse, automatic home BP monitor)     Home cuff 4. HISTORY: "Do you have a history of high blood pressure?"     Yes 5. MEDICATIONS: "Are you taking any medications for blood pressure?" "Have you missed any doses recently?"     Not currently 6. OTHER SYMPTOMS: "Do you have any symptoms?" (e.g., headache, chest pain, blurred vision, difficulty breathing, weakness)     Sometimes has headache 7. PREGNANCY: "Is there any chance you are pregnant?" "When was your last menstrual period?"     N/a  Protocols used: Blood Pressure - High-A-AH

## 2021-06-21 NOTE — Telephone Encounter (Signed)
Patient is scheduled for Friday

## 2021-06-22 ENCOUNTER — Ambulatory Visit: Payer: BC Managed Care – PPO | Attending: Physician Assistant | Admitting: Family

## 2021-06-22 ENCOUNTER — Encounter: Payer: Self-pay | Admitting: Family

## 2021-06-22 ENCOUNTER — Other Ambulatory Visit: Payer: Self-pay

## 2021-06-22 VITALS — BP 144/102 | HR 91 | Ht 73.0 in | Wt 252.8 lb

## 2021-06-22 DIAGNOSIS — I10 Essential (primary) hypertension: Secondary | ICD-10-CM | POA: Diagnosis not present

## 2021-06-22 MED ORDER — AMLODIPINE BESYLATE 10 MG PO TABS: ORAL_TABLET | ORAL | 3 refills | Status: DC

## 2021-06-22 NOTE — Progress Notes (Signed)
Edward Archer, is a 46 y.o. male  QGB:[id number]  FXJ:[id number]  DOB - 1976-02-10  Subjective:  Chief Complaint and HPI: Edward Archer is a 46 y.o. male here today to refill his blood pressure medication.  Patient with a history of high blood pressure has not been taking his medications for a long time, 3 months.  He supposed to be taking amlodipine 10 mg daily.  Patient denies chest pain, shortness of breath, fever, chills, cough sore throat or any other symptom.  ED/Hospital notes reviewed.   Social History: Reviewed Family history: Reviewed  ROS:   Constitutional:  No f/c, No night sweats, No unexplained weight loss. EENT:  No vision changes, No blurry vision, No hearing changes. No mouth, throat, or ear problems.  Respiratory: No cough, No SOB Cardiac: No CP, no palpitations GI:  No abd pain, No N/V/D. Musculoskeletal: No joint pain Neuro: No headache, no dizziness, no motor weakness.  Endocrine:  No polydipsia. No polyuria.  Psych: Denies SI/HI  No problems updated.  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 Authorizing Provider  amLODipine (NORVASC) 10 MG tablet TAKE 1 TABLET(10 MG) BY MOUTH DAILY 04/20/20  Yes Claiborne Rigg, NP  emtricitabine-rilpivir-tenofovir AF (ODEFSEY) 200-25-25 MG TABS tablet Take 1 tablet by mouth daily. 11/22/20  Yes Comer, Belia Heman, MD  pravastatin (PRAVACHOL) 20 MG tablet Take 1 tablet (20 mg total) by mouth daily. 05/21/19  Yes Claiborne Rigg, NP  Multiple Vitamin (MULTIVITAMIN WITH MINERALS) TABS tablet Take 1 tablet by mouth daily. Patient not taking: Reported on 06/22/2021    [provider]   naphazoline-glycerin (CLEAR EYES REDNESS) 0.012-0.2 % SOLN Place 2 drops into both eyes daily as needed for eye irritation. Patient not taking: Reported on 06/22/2021    [provider]     Objective:  EXAM:   Vitals:   06/22/21 0911  BP: (!) 144/102  Pulse: 91  SpO2: 97%  Weight: 252 lb 12.8 oz (114.7 kg)  Height: 6\' 1"  (1.854 m)    General appearance : A&OX3. NAD. Non-toxic-appearing HEENT: Atraumatic and Normocephalic.  PERRLA. EOM intact.  TM clear B. Neck: supple, no JVD. No cervical lymphadenopathy. No thyromegaly Chest/Lungs:  Breathing-non-labored, Good air entry bilaterally, breath sounds normal without rales, rhonchi, or wheezing  CVS: S1 S2 regular, no murmurs, gallops, rubs  Abdomen: Bowel sounds present, Non tender and not distended with no gaurding, rigidity or rebound. Extremities: Bilateral Lower Ext shows no edema, both legs are warm to touch with = pulse throughout Neurology:  CN II-XII grossly intact, Non focal.   Psych:  TP linear. J/I WNL. Normal speech. Appropriate eye contact and affect.   Data Review No results found for: HGBA1C   Assessment & Plan   1. Essential hypertension Refill amlodipine 10 mg 1 tablet by mouth once a day.  Instructed to take medications as prescribed, do not go so long without taking her blood pressure medications stay hydrated, exercise. Call or come back if you have any question.    The patient was given clear instructions to go to ER or return to medical center if symptoms don't improve, worsen or  new problems develop. The patient verbalized understanding. The patient was told to call to get lab results if they haven't heard anything in the next week.     Feliberto Gottron, PA-C [facility] [place], [place]   06/22/2021, 9:18 AM

## 2021-06-22 NOTE — Patient Instructions (Signed)
1) refill medication (Amlodipine 10 mg) today 2) Stay active 3) Contact your provider with question or concern regarding your health

## 2021-06-22 NOTE — Progress Notes (Signed)
Needs refills on medications. No BP medication in a while.

## 2021-06-26 ENCOUNTER — Ambulatory Visit (INDEPENDENT_AMBULATORY_CARE_PROVIDER_SITE_OTHER): Payer: BC Managed Care – PPO | Admitting: Internal Medicine

## 2021-06-26 ENCOUNTER — Ambulatory Visit (INDEPENDENT_AMBULATORY_CARE_PROVIDER_SITE_OTHER): Payer: BC Managed Care – PPO

## 2021-06-26 ENCOUNTER — Other Ambulatory Visit: Payer: Self-pay

## 2021-06-26 ENCOUNTER — Encounter: Payer: Self-pay | Admitting: Internal Medicine

## 2021-06-26 VITALS — BP 140/95 | HR 87 | Resp 16 | Ht 73.0 in | Wt 257.0 lb

## 2021-06-26 DIAGNOSIS — Z23 Encounter for immunization: Secondary | ICD-10-CM

## 2021-06-26 DIAGNOSIS — B20 Human immunodeficiency virus [HIV] disease: Secondary | ICD-10-CM | POA: Diagnosis not present

## 2021-06-26 DIAGNOSIS — Z113 Encounter for screening for infections with a predominantly sexual mode of transmission: Secondary | ICD-10-CM | POA: Diagnosis not present

## 2021-06-26 DIAGNOSIS — E7800 Pure hypercholesterolemia, unspecified: Secondary | ICD-10-CM

## 2021-06-26 NOTE — Progress Notes (Signed)
° °  Subjective:    Patient ID: Edward Archer, male    DOB: 08-08-1975, 46 y.o.   MRN: [id number]  HPI Here for follow up of HIV He continues on [facility] and denies any missed doses.  Recently had to get patient assistance for his medication.  No issues taking or tolerating the medication.  CD4 724, viral load just 31.     Review of Systems  Constitutional:  Negative for fatigue.  Gastrointestinal:  Negative for diarrhea and nausea.  Skin:  Negative for rash.      Objective:   Physical Exam Eyes:     General: No scleral icterus. Pulmonary:     Effort: Pulmonary effort is normal.  Skin:    Findings: No rash.  Neurological:     General: No focal deficit present.     Mental Status: He is alert.  Psychiatric:        Mood and Affect: Mood normal.   SH: no tobacco       Assessment & Plan:

## 2021-06-26 NOTE — Assessment & Plan Note (Signed)
Discussed COVID-19 vaccine and bivalent booster given today.

## 2021-06-26 NOTE — Assessment & Plan Note (Signed)
He is doing well and no new concerns.  No missed doses and he can rtc in 6 months.

## 2021-06-26 NOTE — Progress Notes (Signed)
° °  Covid-19 Vaccination Clinic  Name:  Edward Archer    MRN: [id number] DOB: 1975/06/13  06/26/2021  Edward Archer was observed post Covid-19 immunization for 15 minutes without incident. He was provided with Vaccine Information Sheet and instruction to access the V-Safe system.   Edward Archer was instructed to call 911 with any severe reactions post vaccine: Difficulty breathing  Swelling of face and throat  A fast heartbeat  A bad rash all over body  Dizziness and weakness   Jonelle Sports  CMA [facility]

## 2021-06-26 NOTE — Assessment & Plan Note (Signed)
Discussed Prevnar 20 and flu shot and given today

## 2021-07-12 ENCOUNTER — Ambulatory Visit: Payer: BC Managed Care – PPO | Admitting: Physician Assistant

## 2021-09-19 ENCOUNTER — Ambulatory Visit: Payer: BC Managed Care – PPO | Attending: Nurse Practitioner | Admitting: Nurse Practitioner

## 2021-09-19 ENCOUNTER — Encounter: Payer: Self-pay | Admitting: Nurse Practitioner

## 2021-09-19 VITALS — BP 133/91 | HR 79 | Wt 275.2 lb

## 2021-09-19 DIAGNOSIS — I10 Essential (primary) hypertension: Secondary | ICD-10-CM | POA: Diagnosis not present

## 2021-09-19 DIAGNOSIS — N181 Chronic kidney disease, stage 1: Secondary | ICD-10-CM | POA: Diagnosis not present

## 2021-09-19 DIAGNOSIS — E7800 Pure hypercholesterolemia, unspecified: Secondary | ICD-10-CM | POA: Diagnosis not present

## 2021-09-19 DIAGNOSIS — Z1211 Encounter for screening for malignant neoplasm of colon: Secondary | ICD-10-CM | POA: Diagnosis not present

## 2021-09-19 MED ORDER — HYDROCHLOROTHIAZIDE 25 MG PO TABS: 25.0000 mg | ORAL_TABLET | Freq: Every day | ORAL | 3 refills | Status: DC

## 2021-09-19 MED ORDER — PRAVASTATIN SODIUM 20 MG PO TABS: 20.0000 mg | ORAL_TABLET | Freq: Every day | ORAL | 3 refills | Status: DC

## 2021-09-19 NOTE — Progress Notes (Signed)
? ?Assessment & Plan:  ?Arshia was seen today for hypertension. ? ?Diagnoses and all orders for this visit: ? ?Primary hypertension ?RETURN IN 4 weeks for BP Check ?-     CMP14+EGFR ?-     hydrochlorothiazide (HYDRODIURIL) 25 MG tablet; Take 1 tablet (25 mg total) by mouth daily. ? ?Colon cancer screening ?-     Ambulatory referral to Gastroenterology ? ?CKD (chronic kidney disease) stage 1, GFR 90 ml/min or greater ?-     CMP14+EGFR ? ?Hypercholesteremia ?-     Lipid panel ?-     pravastatin (PRAVACHOL) 20 MG tablet; Take 1 tablet (20 mg total) by mouth daily. ? ? ? ?Patient has been counseled on age-appropriate routine health concerns for screening and prevention. These are reviewed and up-to-date. Referrals have been placed accordingly. Immunizations are up-to-date or declined.    ?Subjective:  ? ?Chief Complaint  ?Patient presents with  ? Hypertension  ? ?HPI ?Edward Archer 46 y.o. male presents to office today for follow up to HTN. 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  ? ?HTN ?Blood pressure is elevated today despite taking amlodipine 10 mg daily.  We will start hydrochlorothiazide 25 mg daily today.  He states home blood pressure readings are similar to today's reading if not high. ?BP Readings from Last 3 Encounters:  ?09/19/21 (!) 133/91  ?06/26/21 (!) 140/95  ?06/22/21 (!) 144/102  ?  ? ?Review of Systems  ?Constitutional:  Negative for fever, malaise/fatigue and weight loss.  ?HENT: Negative.  Negative for nosebleeds.   ?Eyes: Negative.  Negative for blurred vision, double vision and photophobia.  ?Respiratory: Negative.  Negative for cough and shortness of breath.   ?Cardiovascular: Negative.  Negative for chest pain, palpitations and leg swelling.  ?Gastrointestinal: Negative.  Negative for heartburn, nausea and vomiting.  ?Musculoskeletal: Negative.  Negative for myalgias.   ?Neurological: Negative.  Negative for dizziness, focal weakness, seizures and headaches.  ?Psychiatric/Behavioral: Negative.  Negative for suicidal ideas.   ? ?Past Medical History:  ?Diagnosis Date  ? CKD (chronic kidney disease) stage 1, GFR 90 ml/min or greater 09/09/2017  ? HIV infection ([place])   ? Hyperlipidemia   ? Hypertension   ? 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 Treated  ? ? ?Past Surgical History:  ?Procedure Laterality Date  ? NO PAST SURGERIES    ? ? ?Family History  ?Problem Relation Age of Onset  ? Hypertension Mother   ? Diabetes Sister   ? Diabetes Maternal Grandmother   ? Diabetes Paternal Grandmother   ? ? ?Social History Reviewed with no changes to be made today.  ? ?Outpatient Medications Prior to Visit  ?Medication Sig Dispense Refill  ? amLODipine (NORVASC) 10 MG tablet TAKE 1 TABLET(10 MG) BY MOUTH DAILY 90 tablet 3  ? emtricitabine-rilpivir-tenofovir AF (ODEFSEY) 200-25-25 MG TABS tablet Take 1 tablet by mouth daily. 30 tablet 11  ? Multiple Vitamin (MULTIVITAMIN WITH MINERALS) TABS tablet Take 1 tablet by mouth daily.    ? naphazoline-glycerin (CLEAR EYES REDNESS) 0.012-0.2 % SOLN Place 2 drops into both eyes daily as needed for eye irritation. (Patient not taking: Reported on 06/22/2021)    ? pravastatin (PRAVACHOL) 20 MG tablet Take 1 tablet (20 mg total) by mouth daily. 90 tablet 3  ? ?No facility-administered medications prior to visit.  ? ? ?No Known Allergies ? ?   ?Objective:  ?  ?  BP (!) 133/91   Pulse 79   Wt 275 lb 3.2 oz (124.8 kg)   SpO2 98%   BMI 36.31 kg/m?  ?Wt Readings from Last 3 Encounters:  ?09/19/21 275 lb 3.2 oz (124.8 kg)  ?06/26/21 257 lb (116.6 kg)  ?06/22/21 252 lb 12.8 oz (114.7 kg)  ? ? ?Physical Exam ?Vitals and nursing note reviewed.  ?Constitutional:   ?   Appearance: He is well-developed.  ?HENT:  ?   Head: Normocephalic and atraumatic.  ?Cardiovascular:  ?   Rate and Rhythm: Normal rate and regular rhythm.  ?   Heart sounds: Normal heart  sounds. No murmur heard. ?  No friction rub. No gallop.  ?Pulmonary:  ?   Effort: Pulmonary effort is normal. No tachypnea or respiratory distress.  ?   Breath sounds: Normal breath sounds. No decreased breath sounds, wheezing, rhonchi or rales.  ?Chest:  ?   Chest wall: No tenderness.  ?Abdominal:  ?   General: Bowel sounds are normal.  ?   Palpations: Abdomen is soft.  ?Musculoskeletal:     ?   General: Normal range of motion.  ?   Cervical back: Normal range of motion.  ?Skin: ?   General: Skin is warm and dry.  ?Neurological:  ?   Mental Status: He is alert and oriented to person, place, and time.  ?   Coordination: Coordination normal.  ?Psychiatric:     ?   Behavior: Behavior normal. Behavior is cooperative.     ?   Thought Content: Thought content normal.     ?   Judgment: Judgment normal.  ? ? ? ? ?   ?Patient has been counseled extensively about nutrition and exercise as well as the importance of adherence with medications and regular follow-up. The patient was given clear instructions to go to ER or return to medical center if symptoms don't improve, worsen or new problems develop. The patient verbalized understanding.  ? ?Follow-up: Return for 4 weeks BP check luke. See me in 3 months.  ? ?Gildardo Pounds, FNP-BC ?[place] ?[place], [place] ?[phone]   ?09/19/2021, 9:12 AM ?

## 2021-09-20 LAB — CMP14+EGFR
ALT: 25 IU/L (ref 0–44)
AST: 22 IU/L (ref 0–40)
Albumin/Globulin Ratio: 1.7 (ref 1.2–2.2)
Albumin: 4.3 g/dL (ref 4.0–5.0)
Alkaline Phosphatase: 71 IU/L (ref 44–121)
BUN/Creatinine Ratio: 8 — ABNORMAL LOW (ref 9–20)
BUN: 11 mg/dL (ref 6–24)
Bilirubin Total: 0.3 mg/dL (ref 0.0–1.2)
CO2: 25 mmol/L (ref 20–29)
Calcium: 9 mg/dL (ref 8.7–10.2)
Chloride: 105 mmol/L (ref 96–106)
Creatinine, Ser: 1.32 mg/dL — ABNORMAL HIGH (ref 0.76–1.27)
Globulin, Total: 2.5 g/dL (ref 1.5–4.5)
Glucose: 128 mg/dL — ABNORMAL HIGH (ref 70–99)
Potassium: 4.2 mmol/L (ref 3.5–5.2)
Sodium: 144 mmol/L (ref 134–144)
Total Protein: 6.8 g/dL (ref 6.0–8.5)
eGFR: 67 mL/min/{1.73_m2} (ref >59)

## 2021-09-20 LAB — LIPID PANEL
Chol/HDL Ratio: 4.9 ratio (ref 0.0–5.0)
Cholesterol, Total: 252 mg/dL — ABNORMAL HIGH (ref 100–199)
HDL: 51 mg/dL (ref >39)
LDL Chol Calc (NIH): 183 mg/dL — ABNORMAL HIGH (ref 0–99)
Triglycerides: 102 mg/dL (ref 0–149)
VLDL Cholesterol Cal: 18 mg/dL (ref 5–40)

## 2021-10-23 ENCOUNTER — Ambulatory Visit: Payer: BC Managed Care – PPO | Attending: Nurse Practitioner | Admitting: Pharmacist

## 2021-10-23 ENCOUNTER — Encounter: Payer: Self-pay | Admitting: Pharmacist

## 2021-10-23 VITALS — BP 118/83 | HR 80

## 2021-10-23 DIAGNOSIS — I10 Essential (primary) hypertension: Secondary | ICD-10-CM | POA: Diagnosis not present

## 2021-10-23 NOTE — Progress Notes (Signed)
   S:    Edward Archer is a 46 y.o. male who presents for hypertension evaluation, education, and management. PMH is significant for HTN, CKD, HLD, and HIV. Patient was referred and last seen by Primary Care Provider, Geryl Rankins, on 09/19/2021. At last visit, BP 133/91 where HCTZ 25mg  was added to amlodipine 10mg  for blood pressure management.   Today, patient arrives in good spirits and presents without assistance.  Denies dizziness, headache, blurred vision, swelling.   Family/Social history:  Mother: HTN Sister: diabetes Grandmother (maternal): diabetes  Grandmother (paternal): diabetes  Medication adherence is appropriate. Patient has not taken BP medications today.   Current antihypertensives include: amlodipine 10 mg daily, HCTZ 25mg  daily   Reported home BP readings: none reported  Patient reported dietary habits: not discussed today Patient-reported exercise habits: increasing exercise recently, including more walking  O:  Vitals:   10/23/21 0940  BP: 118/83  Pulse: 80   Last 3 Office BP readings: BP Readings from Last 3 Encounters:  10/23/21 118/83  09/19/21 (!) 133/91  06/26/21 (!) 140/95   BMET    Component Value Date/Time   NA 144 09/19/2021 0915   K 4.2 09/19/2021 0915   CL 105 09/19/2021 0915   CO2 25 09/19/2021 0915   GLUCOSE 128 (H) 09/19/2021 0915   GLUCOSE 121 (H) 11/01/2020 1022   BUN 11 09/19/2021 0915   CREATININE 1.32 (H) 09/19/2021 0915   CREATININE 1.17 11/01/2020 1022   CALCIUM 9.0 09/19/2021 0915   GFRNONAA 74 11/01/2020 1022   GFRAA 86 11/01/2020 1022   Renal function: CrCl cannot be calculated (Patient's most recent lab result is older than the maximum 21 days allowed.).  Clinical ASCVD:  The 10-year ASCVD risk score (Arnett DK, et al., 2019) is: 6.9%   Values used to calculate the score:     Age: 63 years     Sex: Male     Is Non-Hispanic African American: Yes     Diabetic: No     Tobacco smoker: No     Systolic Blood Pressure:  118 mmHg     Is BP treated: Yes     HDL Cholesterol: 51 mg/dL     Total Cholesterol: 252 mg/dL  A/P: Hypertension diagnosed currently controlled on current medications. BP goal < 130/80 mmHg. Medication adherence appears appropriate, despite not taking this morning yet.  -Continue current medication. -F/u labs ordered - CMP, CBC and TSH -Counseled on lifestyle modifications for blood pressure control including reduced dietary sodium, increased exercise, adequate sleep. -Encouraged patient to check BP at home and bring log of readings to next visit. Counseled on proper use of home BP cuff.   Results reviewed and written information provided. Patient verbalized understanding of treatment plan. Total time in face-to-face counseling 20 minutes.   F/u clinic visit with Geryl Rankins in 12/2021.   Thank you for allowing pharmacy to participate in this patient's care.  Levonne Spiller, PharmD PGY1 Acute Care Resident  10/23/2021,9:40 AM

## 2021-10-24 LAB — CMP14+EGFR
ALT: 36 IU/L (ref 0–44)
AST: 36 IU/L (ref 0–40)
Albumin/Globulin Ratio: 1.6 (ref 1.2–2.2)
Albumin: 4.1 g/dL (ref 4.0–5.0)
Alkaline Phosphatase: 63 IU/L (ref 44–121)
BUN/Creatinine Ratio: 13 (ref 9–20)
BUN: 17 mg/dL (ref 6–24)
Bilirubin Total: 0.3 mg/dL (ref 0.0–1.2)
CO2: 23 mmol/L (ref 20–29)
Calcium: 9 mg/dL (ref 8.7–10.2)
Chloride: 101 mmol/L (ref 96–106)
Creatinine, Ser: 1.31 mg/dL — ABNORMAL HIGH (ref 0.76–1.27)
Globulin, Total: 2.6 g/dL (ref 1.5–4.5)
Glucose: 135 mg/dL — ABNORMAL HIGH (ref 70–99)
Potassium: 3.6 mmol/L (ref 3.5–5.2)
Sodium: 141 mmol/L (ref 134–144)
Total Protein: 6.7 g/dL (ref 6.0–8.5)
eGFR: 68 mL/min/{1.73_m2} (ref >59)

## 2021-10-24 LAB — CBC WITH DIFFERENTIAL/PLATELET
Basophils Absolute: 0 10*3/uL (ref 0.0–0.2)
Basos: 1 %
EOS (ABSOLUTE): 0.1 10*3/uL (ref 0.0–0.4)
Eos: 2 %
Hematocrit: 42.9 % (ref 37.5–51.0)
Hemoglobin: 14.4 g/dL (ref 13.0–17.7)
Immature Grans (Abs): 0 10*3/uL (ref 0.0–0.1)
Immature Granulocytes: 0 %
Lymphocytes Absolute: 3.1 10*3/uL (ref 0.7–3.1)
Lymphs: 47 %
MCH: 29.5 pg (ref 26.6–33.0)
MCHC: 33.6 g/dL (ref 31.5–35.7)
MCV: 88 fL (ref 79–97)
Monocytes Absolute: 0.5 10*3/uL (ref 0.1–0.9)
Monocytes: 8 %
Neutrophils Absolute: 2.8 10*3/uL (ref 1.4–7.0)
Neutrophils: 42 %
Platelets: 218 10*3/uL (ref 150–450)
RBC: 4.88 x10E6/uL (ref 4.14–5.80)
RDW: 13.1 % (ref 11.6–15.4)
WBC: 6.5 10*3/uL (ref 3.4–10.8)

## 2021-10-24 LAB — TSH: TSH: 1.63 u[IU]/mL (ref 0.450–4.500)

## 2021-11-07 ENCOUNTER — Telehealth: Payer: Self-pay | Admitting: Nurse Practitioner

## 2021-11-07 DIAGNOSIS — I10 Essential (primary) hypertension: Secondary | ICD-10-CM

## 2021-11-07 NOTE — Telephone Encounter (Signed)
Patient called and advised [facility] should have both medications on file for 1 year, Amlodipine sent on 06/22/21 #90/3 refills and Hydrochlorothiazide sent on 09/19/21 #90/3 refills. Advised if they say they don't have those medications on file for refill to call the office back, patient verbalized understanding.

## 2021-11-07 NOTE — Telephone Encounter (Signed)
Medication Refill - Medication: amLODipine (NORVASC) 10 MG tablet, hydrochlorothiazide (HYDRODIURIL) 25 MG tablet  Has the patient contacted their pharmacy? Yes.   (Agent: If no, request that the patient contact the pharmacy for the refill. If patient does not wish to contact the pharmacy document the reason why and proceed with request.) (Agent: If yes, when and what did the pharmacy advise?)  Preferred Pharmacy (with phone number or street name):  [id number] - [place], [place] - [address] AT [facility] OF [address] Phone:  [phone]  Fax:  [phone]     Has the patient been seen for an appointment in the last year OR does the patient have an upcoming appointment? Yes.    Agent: Please be advised that RX refills may take up to 3 business days. We ask that you follow-up with your pharmacy.

## 2021-11-21 ENCOUNTER — Telehealth: Payer: Self-pay

## 2021-11-21 DIAGNOSIS — B20 Human immunodeficiency virus [HIV] disease: Secondary | ICD-10-CM

## 2021-11-21 MED ORDER — ODEFSEY 200-25-25 MG PO TABS: 1.0000 | ORAL_TABLET | Freq: Every day | ORAL | 1 refills | Status: DC

## 2021-11-21 NOTE — Telephone Encounter (Signed)
Received faxed refill request for [facility] from [facility]. Spoke with patient, he confirms he needs to use this pharmacy for insurance reasons. Refill sent.   Sandie Ano, RN

## 2021-12-05 ENCOUNTER — Other Ambulatory Visit: Payer: Self-pay | Admitting: Internal Medicine

## 2021-12-05 DIAGNOSIS — B20 Human immunodeficiency virus [HIV] disease: Secondary | ICD-10-CM

## 2021-12-10 ENCOUNTER — Other Ambulatory Visit: Payer: BC Managed Care – PPO

## 2021-12-10 ENCOUNTER — Other Ambulatory Visit: Payer: Self-pay

## 2021-12-10 ENCOUNTER — Other Ambulatory Visit (HOSPITAL_COMMUNITY)
Admission: RE | Admit: 2021-12-10 | Discharge: 2021-12-10 | Disposition: A | Discharge status: Home / Self Care | Payer: BC Managed Care – PPO | Source: Ambulatory Visit | Attending: Internal Medicine | Admitting: Internal Medicine

## 2021-12-10 DIAGNOSIS — Z113 Encounter for screening for infections with a predominantly sexual mode of transmission: Secondary | ICD-10-CM | POA: Diagnosis present

## 2021-12-10 DIAGNOSIS — B20 Human immunodeficiency virus [HIV] disease: Secondary | ICD-10-CM | POA: Diagnosis present

## 2021-12-10 DIAGNOSIS — E7800 Pure hypercholesterolemia, unspecified: Secondary | ICD-10-CM

## 2021-12-11 LAB — T-HELPER CELL (CD4) - (RCID CLINIC ONLY)
CD4 % Helper T Cell: 35 % (ref 33–65)
CD4 T Cell Abs: 896 /uL (ref 400–1790)

## 2021-12-11 LAB — URINE CYTOLOGY ANCILLARY ONLY
Chlamydia: NEGATIVE
Comment: NEGATIVE
Comment: NORMAL
Neisseria Gonorrhea: NEGATIVE

## 2021-12-13 LAB — COMPLETE METABOLIC PANEL WITH GFR
AG Ratio: 1.4 (calc) (ref 1.0–2.5)
ALT: 33 U/L (ref 9–46)
AST: 48 U/L — ABNORMAL HIGH (ref 10–40)
Albumin: 4.1 g/dL (ref 3.6–5.1)
Alkaline phosphatase (APISO): 49 U/L (ref 36–130)
BUN/Creatinine Ratio: 14 (calc) (ref 6–22)
BUN: 21 mg/dL (ref 7–25)
CO2: 31 mmol/L (ref 20–32)
Calcium: 9.2 mg/dL (ref 8.6–10.3)
Chloride: 98 mmol/L (ref 98–110)
Creat: 1.48 mg/dL — ABNORMAL HIGH (ref 0.60–1.29)
Globulin: 2.9 g/dL (calc) (ref 1.9–3.7)
Glucose, Bld: 145 mg/dL — ABNORMAL HIGH (ref 65–99)
Potassium: 3.3 mmol/L — ABNORMAL LOW (ref 3.5–5.3)
Sodium: 138 mmol/L (ref 135–146)
Total Bilirubin: 0.8 mg/dL (ref 0.2–1.2)
Total Protein: 7 g/dL (ref 6.1–8.1)
eGFR: 58 mL/min/{1.73_m2} — ABNORMAL LOW (ref >=60)

## 2021-12-13 LAB — LIPID PANEL
Cholesterol: 234 mg/dL — ABNORMAL HIGH (ref <200)
HDL: 49 mg/dL (ref >=40)
LDL Cholesterol (Calc): 162 mg/dL (calc) — ABNORMAL HIGH
Non-HDL Cholesterol (Calc): 185 mg/dL (calc) — ABNORMAL HIGH (ref <130)
Total CHOL/HDL Ratio: 4.8 (calc) (ref <5.0)
Triglycerides: 114 mg/dL (ref <150)

## 2021-12-13 LAB — RPR: RPR Ser Ql: NONREACTIVE

## 2021-12-13 LAB — CBC WITH DIFFERENTIAL/PLATELET
Absolute Monocytes: 512 cells/uL (ref 200–950)
Basophils Absolute: 32 cells/uL (ref 0–200)
Basophils Relative: 0.5 %
Eosinophils Absolute: 128 cells/uL (ref 15–500)
Eosinophils Relative: 2 %
HCT: 45.3 % (ref 38.5–50.0)
Hemoglobin: 15.3 g/dL (ref 13.2–17.1)
Lymphs Abs: 2906 cells/uL (ref 850–3900)
MCH: 29.7 pg (ref 27.0–33.0)
MCHC: 33.8 g/dL (ref 32.0–36.0)
MCV: 88 fL (ref 80.0–100.0)
MPV: 10.6 fL (ref 7.5–12.5)
Monocytes Relative: 8 %
Neutro Abs: 2822 cells/uL (ref 1500–7800)
Neutrophils Relative %: 44.1 %
Platelets: 236 10*3/uL (ref 140–400)
RBC: 5.15 10*6/uL (ref 4.20–5.80)
RDW: 12.7 % (ref 11.0–15.0)
Total Lymphocyte: 45.4 %
WBC: 6.4 10*3/uL (ref 3.8–10.8)

## 2021-12-13 LAB — HIV-1 RNA QUANT-NO REFLEX-BLD
HIV 1 RNA Quant: NOT DETECTED Copies/mL
HIV-1 RNA Quant, Log: NOT DETECTED Log cps/mL

## 2021-12-25 ENCOUNTER — Encounter: Payer: BC Managed Care – PPO | Admitting: Internal Medicine

## 2021-12-26 ENCOUNTER — Encounter: Payer: Self-pay | Admitting: Internal Medicine

## 2021-12-26 ENCOUNTER — Ambulatory Visit (INDEPENDENT_AMBULATORY_CARE_PROVIDER_SITE_OTHER): Payer: BC Managed Care – PPO | Admitting: Internal Medicine

## 2021-12-26 ENCOUNTER — Other Ambulatory Visit: Payer: Self-pay

## 2021-12-26 VITALS — BP 120/79 | HR 81 | Temp 97.4°F | Ht 73.0 in | Wt 257.0 lb

## 2021-12-26 DIAGNOSIS — B20 Human immunodeficiency virus [HIV] disease: Secondary | ICD-10-CM | POA: Diagnosis not present

## 2021-12-26 DIAGNOSIS — R635 Abnormal weight gain: Secondary | ICD-10-CM

## 2021-12-26 DIAGNOSIS — Z113 Encounter for screening for infections with a predominantly sexual mode of transmission: Secondary | ICD-10-CM

## 2021-12-26 DIAGNOSIS — R739 Hyperglycemia, unspecified: Secondary | ICD-10-CM

## 2021-12-26 NOTE — Assessment & Plan Note (Signed)
No issues today.  I reviewed with labs with him and no concerns.  No changes indicated and he will return in 6 months.

## 2021-12-26 NOTE — Assessment & Plan Note (Signed)
Mild elevation on his non-fasting labs.  Followed by his PCP

## 2021-12-26 NOTE — Assessment & Plan Note (Signed)
Discussed diet modifications and exercise.

## 2021-12-26 NOTE — Progress Notes (Signed)
   Subjective:    Patient ID: Edward Archer, male    DOB: [birth year], 46 y.o.   MRN: [id number]  HPI Here for follow up of HIV He continues with [facility] and denies any missed doses.  No new issues.  BP in good control on amlodipine and continues to follow up with his PCP.  No concerns.    Review of Systems  Constitutional:  Negative for fatigue.  Gastrointestinal:  Negative for diarrhea and nausea.  Skin:  Negative for rash.       Objective:   Physical Exam Eyes:     General: No scleral icterus. Pulmonary:     Effort: Pulmonary effort is normal.  Neurological:     Mental Status: He is alert.   SH: no tobacco        Assessment & Plan:

## 2021-12-26 NOTE — Assessment & Plan Note (Signed)
Screened negative 

## 2021-12-28 ENCOUNTER — Ambulatory Visit: Payer: BC Managed Care – PPO | Attending: Nurse Practitioner | Admitting: Nurse Practitioner

## 2021-12-28 ENCOUNTER — Encounter: Payer: Self-pay | Admitting: Nurse Practitioner

## 2021-12-28 VITALS — BP 119/73 | HR 86 | Temp 98.2°F | Ht 73.0 in | Wt 253.4 lb

## 2021-12-28 DIAGNOSIS — I10 Essential (primary) hypertension: Secondary | ICD-10-CM | POA: Diagnosis not present

## 2021-12-28 DIAGNOSIS — E876 Hypokalemia: Secondary | ICD-10-CM | POA: Diagnosis not present

## 2021-12-28 DIAGNOSIS — Z23 Encounter for immunization: Secondary | ICD-10-CM

## 2021-12-28 DIAGNOSIS — Z1211 Encounter for screening for malignant neoplasm of colon: Secondary | ICD-10-CM

## 2021-12-28 NOTE — Patient Instructions (Signed)
Please call [facility]  [address] [place], [place] [address] PH# [phone]  Bananas, oranges, cantaloupe, honeydew, apricots, grapefruit  Cooked spinach Cooked broccoli Potatoes Sweet potatoes Mushrooms Peas Cucumbers Zucchini Pumpkins Leafy greens Orange juice  Tomato juice Prune juice Apricot juice Grapefruit juice milk yogurt (ACTIVIA) [employer] Cod Trout Rockfish Beans or legumes that are high in potassium include: Lima beans Pinto beans Kidney beans Soybeans Lentils Nuts Meat and poultry Brown and wild rice Bran cereal Whole-wheat bread and pasta

## 2021-12-28 NOTE — Progress Notes (Signed)
Assessment & Plan:  Sol was seen today for hypertension.  Diagnoses and all orders for this visit:  Primary hypertension -     Basic metabolic panel Continue all antihypertensives as prescribed.  Reminded to bring in blood pressure log for follow  up appointment.  RECOMMENDATIONS: DASH/Mediterranean Diets are healthier choices for HTN.    Need for HPV vaccine -     HPV 9-valent vaccine,Recombinat  Hypokalemia -     Basic metabolic panel  Colon cancer screening -     Cologuard    Patient has been counseled on age-appropriate routine health concerns for screening and prevention. These are reviewed and up-to-date. Referrals have been placed accordingly. Immunizations are up-to-date or declined.    Subjective:   Chief Complaint  Patient presents with   Hypertension   Hypertension Pertinent negatives include no blurred vision, chest pain, headaches, malaise/fatigue, palpitations or shortness of breath.   Edward Archer 46 y.o. 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   HTN Blood pressure is well controlled. He is taking HCTZ 25 mg daily and amlodipine 10 mg daily as prescribed. He does not monitor his blood pressure at home BP Readings from Last 3 Encounters:  12/28/21 119/73  12/26/21 120/79  10/23/21 118/83        Review of Systems  Constitutional:  Negative for fever, malaise/fatigue and weight loss.  HENT: Negative.  Negative for nosebleeds.   Eyes: Negative.  Negative for blurred vision, double vision and photophobia.  Respiratory: Negative.  Negative for cough and shortness of breath.   Cardiovascular: Negative.  Negative for chest pain, palpitations and leg swelling.  Gastrointestinal: Negative.  Negative for heartburn, nausea and vomiting.   Musculoskeletal: Negative.  Negative for myalgias.  Neurological: Negative.  Negative for dizziness, focal weakness, seizures and headaches.  Psychiatric/Behavioral: Negative.  Negative for suicidal ideas.     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   Outpatient Medications Prior to Visit  Medication Sig Dispense Refill   amLODipine (NORVASC) 10 MG tablet TAKE 1 TABLET(10 MG) BY MOUTH DAILY 90 tablet 3   Brimonidine Tartrate (LUMIFY OP) Apply 1 drop to eye daily as needed.     emtricitabine-rilpivir-tenofovir AF (ODEFSEY) 200-25-25 MG TABS tablet Take 1 tablet by mouth daily. 30 tablet 1   hydrochlorothiazide (HYDRODIURIL) 25 MG tablet Take 1 tablet (25 mg total) by mouth daily. 90 tablet 3   pravastatin (PRAVACHOL) 20 MG tablet Take 1 tablet (20 mg total) by mouth daily. 90 tablet 3   No facility-administered medications prior to visit.    No Known Allergies     Objective:    BP 119/73   Pulse 86   Temp 98.2 F (36.8 C) (Oral)  Ht 6\' 1"  (1.854 m)   Wt 253 lb 6.4 oz (114.9 kg)   SpO2 97%   BMI 33.43 kg/m  Wt Readings from Last 3 Encounters:  12/28/21 253 lb 6.4 oz (114.9 kg)  12/26/21 257 lb (116.6 kg)  09/19/21 275 lb 3.2 oz (124.8 kg)    Physical Exam Vitals and nursing note reviewed.  Constitutional:      Appearance: He is well-developed.  HENT:     Head: Normocephalic and atraumatic.  Cardiovascular:     Rate and Rhythm: Normal rate and regular rhythm.     Heart  sounds: Normal heart sounds. No murmur heard.    No friction rub. No gallop.  Pulmonary:     Effort: Pulmonary effort is normal. No tachypnea or respiratory distress.     Breath sounds: Normal breath sounds. No decreased breath sounds, wheezing, rhonchi or rales.  Chest:     Chest wall: No tenderness.  Abdominal:     General: Bowel sounds are normal.     Palpations: Abdomen is soft.  Musculoskeletal:        General: Normal range of motion.     Cervical back: Normal range of motion.  Skin:    General: Skin is warm and dry.  Neurological:     Mental Status: He is alert and oriented to person, place, and time.     Coordination: Coordination normal.  Psychiatric:        Behavior: Behavior normal. Behavior is cooperative.        Thought Content: Thought content normal.        Judgment: Judgment normal.          Patient has been counseled extensively about nutrition and exercise as well as the importance of adherence with medications and regular follow-up. The patient was given clear instructions to go to ER or return to medical center if symptoms don't improve, worsen or new problems develop. The patient verbalized understanding.   Follow-up: Return in about 6 months (around 06/30/2022) for HTN.   07/02/2022, FNP-BC [facility] and [facility] [place], [place]   12/28/2021, 9:25 AM

## 2021-12-29 LAB — BASIC METABOLIC PANEL
BUN/Creatinine Ratio: 15 (ref 9–20)
BUN: 20 mg/dL (ref 6–24)
CO2: 25 mmol/L (ref 20–29)
Calcium: 9.2 mg/dL (ref 8.7–10.2)
Chloride: 99 mmol/L (ref 96–106)
Creatinine, Ser: 1.33 mg/dL — ABNORMAL HIGH (ref 0.76–1.27)
Glucose: 139 mg/dL — ABNORMAL HIGH (ref 70–99)
Potassium: 3.6 mmol/L (ref 3.5–5.2)
Sodium: 139 mmol/L (ref 134–144)
eGFR: 66 mL/min/{1.73_m2} (ref >59)

## 2022-01-02 ENCOUNTER — Encounter: Payer: Self-pay | Admitting: Nurse Practitioner

## 2022-01-15 LAB — COLOGUARD: COLOGUARD: NEGATIVE

## 2022-02-05 ENCOUNTER — Other Ambulatory Visit: Payer: Self-pay

## 2022-02-05 DIAGNOSIS — B20 Human immunodeficiency virus [HIV] disease: Secondary | ICD-10-CM

## 2022-02-05 MED ORDER — ODEFSEY 200-25-25 MG PO TABS: 1.0000 | ORAL_TABLET | Freq: Every day | ORAL | 1 refills | Status: DC

## 2022-04-03 ENCOUNTER — Other Ambulatory Visit: Payer: Self-pay | Admitting: Internal Medicine

## 2022-04-03 DIAGNOSIS — B20 Human immunodeficiency virus [HIV] disease: Secondary | ICD-10-CM

## 2022-06-04 ENCOUNTER — Other Ambulatory Visit (HOSPITAL_COMMUNITY): Payer: Self-pay

## 2022-06-04 ENCOUNTER — Telehealth: Payer: Self-pay

## 2022-06-04 NOTE — Telephone Encounter (Signed)
[facility] Patient Advocate Encounter   Received notification from Chance PA that prior authorization for Edward Archer is required.   PA submitted on 06/04/22 Key [id number] Status is pending    [facility] will continue to follow.   Ileene Patrick, [facility] Patient [facility] Phone: [phone] Fax:  [phone]

## 2022-06-06 ENCOUNTER — Other Ambulatory Visit (HOSPITAL_COMMUNITY): Payer: Self-pay

## 2022-06-06 ENCOUNTER — Telehealth: Payer: Self-pay

## 2022-06-06 NOTE — Telephone Encounter (Signed)
[facility] Patient Advocate Encounter  Prior Authorization for Vernell Leep has been approved.    PA# [id number] Effective dates: 06/05/22 through 06/05/23    [facility] will continue to follow.  Ileene Patrick, [facility] Patient [facility] Phone: [phone] Fax:  [phone]

## 2022-06-26 ENCOUNTER — Other Ambulatory Visit: Payer: BC Managed Care – PPO

## 2022-06-26 ENCOUNTER — Other Ambulatory Visit: Payer: Self-pay

## 2022-06-26 DIAGNOSIS — B20 Human immunodeficiency virus [HIV] disease: Secondary | ICD-10-CM

## 2022-06-27 LAB — T-HELPER CELL (CD4) - (RCID CLINIC ONLY)
CD4 % Helper T Cell: 39 % (ref 33–65)
CD4 T Cell Abs: 1039 /uL (ref 400–1790)

## 2022-06-29 LAB — HIV-1 RNA QUANT-NO REFLEX-BLD
HIV 1 RNA Quant: NOT DETECTED Copies/mL
HIV-1 RNA Quant, Log: NOT DETECTED Log cps/mL

## 2022-07-01 ENCOUNTER — Encounter: Payer: Self-pay | Admitting: Nurse Practitioner

## 2022-07-01 ENCOUNTER — Ambulatory Visit: Payer: BC Managed Care – PPO | Attending: Nurse Practitioner | Admitting: Nurse Practitioner

## 2022-07-01 VITALS — BP 125/84 | HR 84 | Ht 73.0 in | Wt 264.4 lb

## 2022-07-01 DIAGNOSIS — Z23 Encounter for immunization: Secondary | ICD-10-CM | POA: Diagnosis not present

## 2022-07-01 DIAGNOSIS — R7309 Other abnormal glucose: Secondary | ICD-10-CM

## 2022-07-01 DIAGNOSIS — E7800 Pure hypercholesterolemia, unspecified: Secondary | ICD-10-CM

## 2022-07-01 DIAGNOSIS — H6691 Otitis media, unspecified, right ear: Secondary | ICD-10-CM

## 2022-07-01 DIAGNOSIS — I10 Essential (primary) hypertension: Secondary | ICD-10-CM

## 2022-07-01 DIAGNOSIS — B20 Human immunodeficiency virus [HIV] disease: Secondary | ICD-10-CM

## 2022-07-01 MED ORDER — HYDROCHLOROTHIAZIDE 25 MG PO TABS: 25.0000 mg | ORAL_TABLET | Freq: Every day | ORAL | 3 refills | Status: DC

## 2022-07-01 MED ORDER — AMLODIPINE BESYLATE 10 MG PO TABS: ORAL_TABLET | ORAL | 3 refills | Status: DC

## 2022-07-01 MED ORDER — AMOXICILLIN-POT CLAVULANATE 875-125 MG PO TABS: 1.0000 | ORAL_TABLET | Freq: Two times a day (BID) | ORAL | 0 refills | Status: DC

## 2022-07-01 NOTE — Progress Notes (Signed)
Assessment & Plan:  Edward Archer was seen today for hypertension.  Diagnoses and all orders for this visit:  Primary hypertension -     amLODipine (NORVASC) 10 MG tablet; TAKE 1 TABLET(10 MG) BY MOUTH DAILY -     hydrochlorothiazide (HYDRODIURIL) 25 MG tablet; Take 1 tablet (25 mg total) by mouth daily. -     CMP14+EGFR Continue all antihypertensives as prescribed.  Reminded to bring in blood pressure log for follow  up appointment.  RECOMMENDATIONS: DASH/Mediterranean Diets are healthier choices for HTN.    Elevated glucose -     CMP14+EGFR -     Hemoglobin A1c  Hypercholesterolemia -     Lipid panel  Human immunodeficiency virus (HIV) disease (HCC) -     CBC with Differential  Infective right otitis media -     amoxicillin-clavulanate (AUGMENTIN) 875-125 MG tablet; Take 1 tablet by mouth 2 (two) times daily.    Patient has been counseled on age-appropriate routine health concerns for screening and prevention. These are reviewed and up-to-date. Referrals have been placed accordingly. Immunizations are up-to-date or declined.    Subjective:   Chief Complaint  Patient presents with   Hypertension    Edward Archer 47 y.o. male presents to office today for follow up to HTN.  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     HTN Blood pressure is well controlled. He is taking HCTZ 25 mg daily and amlodipine 10 mg daily as prescribed. He has his home blood pressure monitoring device with him today which correlated with our office reading today.  BP Readings from Last 3 Encounters:  07/01/22 125/84  12/28/21 119/73  12/26/21 120/79     He has complaints today of left ear pain however upon physical exam today the right ear reveals otitis media.  Normal left tympanic membrane.     Has not been taking pravastatin.  He has been  trying to lose weight and exercising in the gym however has not adhered to dietary modifications.  We discussed caloric deficit and increase protein intake for weight loss today. The 10-year ASCVD risk score (Arnett DK, et al., 2019) is: 8%   Values used to calculate the score:     Age: 28 years     Sex: Male     Is Non-Hispanic African American: Yes     Diabetic: No     Tobacco smoker: No     Systolic Blood Pressure: 0000000 mmHg     Is BP treated: Yes     HDL Cholesterol: 49 mg/dL     Total Cholesterol: 234 mg/dL     Review of Systems  Constitutional:  Negative for fever, malaise/fatigue and weight loss.  HENT:  Positive for ear pain. Negative for ear discharge and nosebleeds.   Eyes: Negative.  Negative for blurred vision, double vision and photophobia.  Respiratory: Negative.  Negative for cough and shortness of breath.   Cardiovascular: Negative.  Negative for chest pain, palpitations and leg swelling.  Gastrointestinal: Negative.  Negative for heartburn, nausea and vomiting.  Musculoskeletal: Negative.  Negative for myalgias.  Neurological: Negative.  Negative for dizziness, focal weakness, seizures and headaches.  Psychiatric/Behavioral: Negative.  Negative for suicidal ideas.     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   Outpatient Medications Prior to Visit  Medication Sig Dispense Refill   Brimonidine Tartrate (LUMIFY OP) Apply 1 drop to eye daily as needed.      emtricitabine-rilpivir-tenofovir AF (ODEFSEY) 200-25-25 MG TABS tablet TAKE 1 TABLET BY MOUTH DAILY 30 tablet 4   amLODipine (NORVASC) 10 MG tablet TAKE 1 TABLET(10 MG) BY MOUTH DAILY 90 tablet 3   hydrochlorothiazide (HYDRODIURIL) 25 MG tablet Take 1 tablet (25 mg total) by mouth daily. 90 tablet 3   pravastatin (PRAVACHOL) 20 MG tablet Take 1 tablet (20 mg total) by mouth daily. (Patient not taking: Reported on 07/01/2022) 90 tablet 3   No facility-administered medications prior to visit.    No Known Allergies     Objective:    BP 125/84 Comment: Patients BP machine- 123/89  Pulse 84   Ht 6' 1"$  (1.854 m)   Wt 264 lb 6.4 oz (119.9 kg)   SpO2 97%   BMI 34.88 kg/m  Wt Readings from Last 3 Encounters:  07/01/22 264 lb 6.4 oz (119.9 kg)  12/28/21 253 lb 6.4 oz (114.9 kg)  12/26/21 257 lb (116.6 kg)    Physical Exam Vitals and nursing note reviewed.  Constitutional:      Appearance: He is well-developed.  HENT:     Head: Normocephalic and atraumatic.     Right Ear: Tympanic membrane is erythematous.     Left Ear: Hearing, ear canal and external ear normal. A middle ear effusion is present.  Cardiovascular:     Rate and Rhythm: Normal rate and regular rhythm.     Heart sounds: Normal heart sounds. No murmur heard.    No friction rub. No gallop.  Pulmonary:     Effort: Pulmonary effort is normal. No tachypnea or respiratory distress.     Breath sounds: Normal breath sounds. No decreased breath sounds, wheezing, rhonchi or rales.  Chest:     Chest wall: No tenderness.  Abdominal:     General: Bowel sounds are normal.     Palpations: Abdomen is soft.  Musculoskeletal:        General: Normal range of motion.     Cervical back: Normal range of motion.  Skin:    General: Skin is warm and dry.  Neurological:     Mental Status: He is alert and oriented to person, place, and time.     Coordination: Coordination normal.  Psychiatric:        Behavior: Behavior normal. Behavior  is cooperative.        Thought Content: Thought content normal.        Judgment: Judgment normal.          Patient has been counseled extensively about nutrition and exercise as well as the importance of adherence with medications and regular follow-up. The patient was given clear instructions to go to ER or return to medical center if symptoms don't improve, worsen or new problems develop. The patient verbalized understanding.   Follow-up: Return in about 6 months (around 12/30/2022).  Gildardo Pounds, FNP-BC [facility] and [facility] [place], [place]   07/01/2022, 9:46 AM

## 2022-07-02 LAB — CBC WITH DIFFERENTIAL/PLATELET
Basophils Absolute: 0 10*3/uL (ref 0.0–0.2)
Basos: 1 %
EOS (ABSOLUTE): 0.1 10*3/uL (ref 0.0–0.4)
Eos: 2 %
Hematocrit: 46.5 % (ref 37.5–51.0)
Hemoglobin: 15.3 g/dL (ref 13.0–17.7)
Immature Grans (Abs): 0 10*3/uL (ref 0.0–0.1)
Immature Granulocytes: 0 %
Lymphocytes Absolute: 3 10*3/uL (ref 0.7–3.1)
Lymphs: 44 %
MCH: 28.4 pg (ref 26.6–33.0)
MCHC: 32.9 g/dL (ref 31.5–35.7)
MCV: 86 fL (ref 79–97)
Monocytes Absolute: 0.6 10*3/uL (ref 0.1–0.9)
Monocytes: 9 %
Neutrophils Absolute: 3.1 10*3/uL (ref 1.4–7.0)
Neutrophils: 44 %
Platelets: 239 10*3/uL (ref 150–450)
RBC: 5.38 x10E6/uL (ref 4.14–5.80)
RDW: 13.5 % (ref 11.6–15.4)
WBC: 6.8 10*3/uL (ref 3.4–10.8)

## 2022-07-02 LAB — CMP14+EGFR
ALT: 35 IU/L (ref 0–44)
AST: 27 IU/L (ref 0–40)
Albumin/Globulin Ratio: 1.5 (ref 1.2–2.2)
Albumin: 4.2 g/dL (ref 4.1–5.1)
Alkaline Phosphatase: 52 IU/L (ref 44–121)
BUN/Creatinine Ratio: 12 (ref 9–20)
BUN: 16 mg/dL (ref 6–24)
Bilirubin Total: 0.4 mg/dL (ref 0.0–1.2)
CO2: 26 mmol/L (ref 20–29)
Calcium: 9 mg/dL (ref 8.7–10.2)
Chloride: 99 mmol/L (ref 96–106)
Creatinine, Ser: 1.3 mg/dL — ABNORMAL HIGH (ref 0.76–1.27)
Globulin, Total: 2.8 g/dL (ref 1.5–4.5)
Glucose: 147 mg/dL — ABNORMAL HIGH (ref 70–99)
Potassium: 3.5 mmol/L (ref 3.5–5.2)
Sodium: 141 mmol/L (ref 134–144)
Total Protein: 7 g/dL (ref 6.0–8.5)
eGFR: 68 mL/min/{1.73_m2} (ref >59)

## 2022-07-02 LAB — LIPID PANEL
Chol/HDL Ratio: 5.2 ratio — ABNORMAL HIGH (ref 0.0–5.0)
Cholesterol, Total: 274 mg/dL — ABNORMAL HIGH (ref 100–199)
HDL: 53 mg/dL (ref >39)
LDL Chol Calc (NIH): 203 mg/dL — ABNORMAL HIGH (ref 0–99)
Triglycerides: 103 mg/dL (ref 0–149)
VLDL Cholesterol Cal: 18 mg/dL (ref 5–40)

## 2022-07-02 LAB — HEMOGLOBIN A1C
Est. average glucose Bld gHb Est-mCnc: 177 mg/dL
Hgb A1c MFr Bld: 7.8 % — ABNORMAL HIGH (ref 4.8–5.6)

## 2022-07-04 ENCOUNTER — Other Ambulatory Visit: Payer: Self-pay | Admitting: Nurse Practitioner

## 2022-07-04 MED ORDER — METFORMIN HCL 500 MG PO TABS: 500.0000 mg | ORAL_TABLET | Freq: Every day | ORAL | 1 refills | Status: DC

## 2022-07-09 ENCOUNTER — Ambulatory Visit: Payer: Self-pay | Admitting: *Deleted

## 2022-07-09 NOTE — Telephone Encounter (Signed)
Questions on diabetes diagnosis   Per agent "Pt stated earlier today that the nurse called him and advised him that he has diabetes and needs to start taking the medication metformin. Pt stated a lot of people in his family are diabetics, and he wanted to know if he should be checking his sugar. Stated the nurse did not mention any of that, so he wanted to discuss."      Chief Complaint: Questions regarding diagnosis Symptoms: NA Frequency: NA Pertinent Negatives: Patient denies NA Disposition: '[]'$ ED /'[]'$ Urgent Care (no appt availability in office) / '[]'$ Appointment(In office/virtual)/ '[]'$  [facility]/ '[]'$ Home Care/ '[]'$ Refused Recommended Disposition /'[]'$ [facility]/ '[x]'$  Follow-up with PCP Additional Notes: Pt states he was told he was diabetic and metformin called in. Questioning if he should be checking his blood sugar at home and if so, needs supplies to do so. Please advise.  Reason for Disposition  [1] Caller requesting NON-URGENT health information AND [2] PCP's office is the best resource  Answer Assessment - Initial Assessment Questions 1. REASON FOR CALL or QUESTION: "What is your reason for calling today?" or "How can I best help you?" or "What question do you have that I can help answer?"     Home care question  Protocols used: Information Only Call - No Triage-A-AH

## 2022-07-10 ENCOUNTER — Encounter: Payer: Self-pay | Admitting: Internal Medicine

## 2022-07-10 ENCOUNTER — Other Ambulatory Visit: Payer: Self-pay

## 2022-07-10 ENCOUNTER — Ambulatory Visit (INDEPENDENT_AMBULATORY_CARE_PROVIDER_SITE_OTHER): Payer: BC Managed Care – PPO | Admitting: Internal Medicine

## 2022-07-10 VITALS — BP 123/89 | HR 81 | Temp 97.8°F | Ht 73.0 in | Wt 265.0 lb

## 2022-07-10 DIAGNOSIS — Z113 Encounter for screening for infections with a predominantly sexual mode of transmission: Secondary | ICD-10-CM

## 2022-07-10 DIAGNOSIS — J302 Other seasonal allergic rhinitis: Secondary | ICD-10-CM

## 2022-07-10 DIAGNOSIS — R635 Abnormal weight gain: Secondary | ICD-10-CM | POA: Diagnosis not present

## 2022-07-10 DIAGNOSIS — B20 Human immunodeficiency virus [HIV] disease: Secondary | ICD-10-CM | POA: Diagnosis not present

## 2022-07-10 NOTE — Assessment & Plan Note (Signed)
I discussed weight loss and diet modifications

## 2022-07-10 NOTE — Assessment & Plan Note (Signed)
Some recent watering of his eyes.  He will try Claritin

## 2022-07-10 NOTE — Assessment & Plan Note (Signed)
He is doing well and labs reviewed with him.  No concerns and will continue with the same.   Rtc in 61month

## 2022-07-10 NOTE — Progress Notes (Signed)
   Subjective:    Patient ID: Edward Archer, male    DOB: [birth year], 47 y.o.   MRN: [id number]  HPI Edward Archer is here for follow up of HIV He continues on [facility] with no missed doses.  He has had no issues with getting or taking his antibiotics.  He recently saw his PCP and now on metformin with a Hgb A1c of 7.8.     Review of Systems  Constitutional:  Negative for fatigue.  Gastrointestinal:  Negative for diarrhea and nausea.  Skin:  Negative for rash.       Objective:   Physical Exam Eyes:     General: No scleral icterus. Pulmonary:     Effort: Pulmonary effort is normal.  Skin:    Findings: No rash.  Neurological:     Mental Status: He is alert.   SH: no tobacco        Assessment & Plan:

## 2022-07-15 ENCOUNTER — Other Ambulatory Visit: Payer: Self-pay | Admitting: Nurse Practitioner

## 2022-07-15 ENCOUNTER — Telehealth: Payer: Self-pay | Admitting: Emergency Medicine

## 2022-07-15 DIAGNOSIS — E1165 Type 2 diabetes mellitus with hyperglycemia: Secondary | ICD-10-CM

## 2022-07-15 MED ORDER — BLOOD GLUCOSE MONITORING SUPPL DEVI: 1.0000 | Freq: Two times a day (BID) | 0 refills | Status: AC

## 2022-07-15 MED ORDER — LANCET DEVICE MISC: 1.0000 | Freq: Two times a day (BID) | 0 refills | Status: AC

## 2022-07-15 MED ORDER — BLOOD GLUCOSE TEST VI STRP: 1.0000 | ORAL_STRIP | Freq: Two times a day (BID) | 6 refills | Status: AC

## 2022-07-15 MED ORDER — LANCETS MISC. MISC: 1.0000 | Freq: Two times a day (BID) | 6 refills | Status: AC

## 2022-07-15 NOTE — Telephone Encounter (Signed)
Copied from [place] [id number]. Topic: General - Other >> Jul 15, 2022 10:36 AM Oley Balm E wrote: Reason for CRM: Pt wants to know if he should be prescribed a blood glucose monitor + testing supplies? Please advise. Was just prescribed metformin

## 2022-07-15 NOTE — Telephone Encounter (Signed)
SUPPLIES SENT TO PHARMACY

## 2022-07-23 ENCOUNTER — Other Ambulatory Visit: Payer: Self-pay

## 2022-07-23 ENCOUNTER — Ambulatory Visit: Payer: BC Managed Care – PPO

## 2022-07-23 ENCOUNTER — Ambulatory Visit: Payer: BC Managed Care – PPO | Attending: Family Medicine

## 2022-07-23 DIAGNOSIS — E1165 Type 2 diabetes mellitus with hyperglycemia: Secondary | ICD-10-CM

## 2022-07-24 ENCOUNTER — Other Ambulatory Visit: Payer: Self-pay | Admitting: Nurse Practitioner

## 2022-07-24 DIAGNOSIS — R7989 Other specified abnormal findings of blood chemistry: Secondary | ICD-10-CM

## 2022-07-24 LAB — CMP14+EGFR
ALT: 34 IU/L (ref 0–44)
AST: 29 IU/L (ref 0–40)
Albumin/Globulin Ratio: 1.8 (ref 1.2–2.2)
Albumin: 4.3 g/dL (ref 4.1–5.1)
Alkaline Phosphatase: 43 IU/L — ABNORMAL LOW (ref 44–121)
BUN/Creatinine Ratio: 11 (ref 9–20)
BUN: 16 mg/dL (ref 6–24)
Bilirubin Total: 0.5 mg/dL (ref 0.0–1.2)
CO2: 27 mmol/L (ref 20–29)
Calcium: 9.5 mg/dL (ref 8.7–10.2)
Chloride: 103 mmol/L (ref 96–106)
Creatinine, Ser: 1.51 mg/dL — ABNORMAL HIGH (ref 0.76–1.27)
Globulin, Total: 2.4 g/dL (ref 1.5–4.5)
Glucose: 135 mg/dL — ABNORMAL HIGH (ref 70–99)
Potassium: 3.6 mmol/L (ref 3.5–5.2)
Sodium: 145 mmol/L — ABNORMAL HIGH (ref 134–144)
Total Protein: 6.7 g/dL (ref 6.0–8.5)
eGFR: 57 mL/min/{1.73_m2} — ABNORMAL LOW (ref >59)

## 2022-09-02 ENCOUNTER — Other Ambulatory Visit: Payer: Self-pay | Admitting: Internal Medicine

## 2022-09-02 DIAGNOSIS — B20 Human immunodeficiency virus [HIV] disease: Secondary | ICD-10-CM

## 2022-12-26 ENCOUNTER — Other Ambulatory Visit: Payer: Self-pay

## 2022-12-26 ENCOUNTER — Other Ambulatory Visit: Payer: BC Managed Care – PPO

## 2022-12-26 ENCOUNTER — Other Ambulatory Visit (HOSPITAL_COMMUNITY)
Admission: RE | Admit: 2022-12-26 | Discharge: 2022-12-26 | Disposition: A | Discharge status: Home / Self Care | Payer: BC Managed Care – PPO | Source: Ambulatory Visit | Attending: Internal Medicine | Admitting: Internal Medicine

## 2022-12-26 DIAGNOSIS — B20 Human immunodeficiency virus [HIV] disease: Secondary | ICD-10-CM

## 2022-12-26 DIAGNOSIS — Z113 Encounter for screening for infections with a predominantly sexual mode of transmission: Secondary | ICD-10-CM | POA: Diagnosis present

## 2022-12-27 LAB — T-HELPER CELL (CD4) - (RCID CLINIC ONLY)
CD4 % Helper T Cell: 40 % (ref 33–65)
CD4 T Cell Abs: 1236 /uL (ref 400–1790)

## 2022-12-27 LAB — URINE CYTOLOGY ANCILLARY ONLY
Chlamydia: NEGATIVE
Comment: NEGATIVE
Comment: NORMAL
Neisseria Gonorrhea: NEGATIVE

## 2022-12-30 ENCOUNTER — Encounter: Payer: Self-pay | Admitting: Nurse Practitioner

## 2022-12-30 ENCOUNTER — Ambulatory Visit: Payer: BC Managed Care – PPO | Attending: Nurse Practitioner | Admitting: Nurse Practitioner

## 2022-12-30 VITALS — BP 125/83 | HR 77 | Ht 73.0 in | Wt 251.4 lb

## 2022-12-30 DIAGNOSIS — Z7984 Long term (current) use of oral hypoglycemic drugs: Secondary | ICD-10-CM | POA: Diagnosis not present

## 2022-12-30 DIAGNOSIS — E1165 Type 2 diabetes mellitus with hyperglycemia: Secondary | ICD-10-CM

## 2022-12-30 DIAGNOSIS — E7800 Pure hypercholesterolemia, unspecified: Secondary | ICD-10-CM

## 2022-12-30 DIAGNOSIS — I10 Essential (primary) hypertension: Secondary | ICD-10-CM

## 2022-12-30 LAB — POCT GLYCOSYLATED HEMOGLOBIN (HGB A1C): HbA1c, POC (controlled diabetic range): 6.4 % (ref 0.0–7.0)

## 2022-12-30 LAB — GLUCOSE, POCT (MANUAL RESULT ENTRY): POC Glucose: 141 mg/dl — AB (ref 70–99)

## 2022-12-30 MED ORDER — PRAVASTATIN SODIUM 20 MG PO TABS
20.0000 mg | ORAL_TABLET | Freq: Every day | ORAL | 3 refills | Status: AC
Start: 2022-12-30 — End: ?

## 2022-12-30 MED ORDER — METFORMIN HCL 500 MG PO TABS
500.0000 mg | ORAL_TABLET | Freq: Every day | ORAL | 1 refills | Status: DC
Start: 2022-12-30 — End: 2023-03-12

## 2022-12-30 NOTE — Progress Notes (Signed)
Assessment & Plan:  Belton was seen today for medical management of chronic issues.  Diagnoses and all orders for this visit:  Type 2 diabetes mellitus with hyperglycemia, without long-term current use of insulin (HCC) -     metFORMIN (GLUCOPHAGE) 500 MG tablet; Take 1 tablet (500 mg total) by mouth daily with breakfast. -     CMP14+EGFR -     POCT glycosylated hemoglobin (Hb A1C) -     POCT glucose (manual entry) -     Urine Albumin/Creatinine with ratio (send out) [LAB689]  Primary hypertension -     CMP14+EGFR  Hypercholesterolemia -     Lipid panel  Hypercholesteremia -     pravastatin (PRAVACHOL) 20 MG tablet; Take 1 tablet (20 mg total) by mouth daily.    Patient has been counseled on age-appropriate routine health concerns for screening and prevention. These are reviewed and up-to-date. Referrals have been placed accordingly. Immunizations are up-to-date or declined.    Subjective:   Chief Complaint  Patient presents with   Medical Management of Chronic Issues   HPI Edward Archer 47 y.o. 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       HTN Blood pressure is well controlled. He is taking HCTZ 25 mg daily and amlodipine 10 mg daily as prescribed. Home blood pressure readings are at goal of <130/80 BP Readings from Last 3 Encounters:  12/30/22 125/83  07/10/22 123/89  07/01/22 125/84    DM 2 Well controlled and at goal of <6.5.  He is taking metformin 500 mg daily as prescribed. Weight is down 10lbs Lab Results  Component Value Date   HGBA1C 6.4 12/30/2022    Lab Results  Component Value Date   HGBA1C 7.8 (H) 07/01/2022    He has not received his pravastatin recently. Will resend through mail order today.  The 10-year ASCVD risk score (Arnett DK,  et al., 2019) is: 15.1%   Values used to calculate the score:     Age: 50 years     Sex: Male     Is Non-Hispanic African American: Yes     Diabetic: Yes     Tobacco smoker: No     Systolic Blood Pressure: 125 mmHg     Is BP treated: Yes     HDL Cholesterol: 53 mg/dL     Total Cholesterol: 274 mg/dL  Review of Systems  Constitutional:  Negative for fever, malaise/fatigue and weight loss.  HENT: Negative.  Negative for nosebleeds.   Eyes: Negative.  Negative for blurred vision, double vision and photophobia.  Respiratory: Negative.  Negative for cough and shortness of breath.   Cardiovascular: Negative.  Negative for chest pain, palpitations and leg swelling.  Gastrointestinal: Negative.  Negative for heartburn, nausea and vomiting.  Musculoskeletal: Negative.  Negative for myalgias.  Neurological: Negative.  Negative for dizziness, focal weakness, seizures and headaches.  Psychiatric/Behavioral: Negative.  Negative for suicidal ideas.     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   Outpatient Medications Prior to Visit  Medication Sig Dispense Refill   amLODipine (NORVASC) 10 MG tablet TAKE 1 TABLET(10 MG) BY MOUTH DAILY 90 tablet 3   Blood Glucose Monitoring Suppl DEVI 1 each by Does not apply route 2 (two) times daily. May substitute to any manufacturer covered by patient's insurance. 1 each 0   Brimonidine Tartrate (LUMIFY OP) Apply 1 drop to eye daily as needed.      emtricitabine-rilpivir-tenofovir AF (ODEFSEY) 200-25-25 MG TABS tablet TAKE 1 TABLET BY MOUTH DAILY 30 tablet 5   Glucose Blood (BLOOD GLUCOSE TEST STRIPS) STRP 1 each by In Vitro route 2 (two) times daily. May substitute to any manufacturer covered by patient's insurance. 100 strip 6   hydrochlorothiazide (HYDRODIURIL) 25 MG tablet Take 1 tablet (25 mg total) by mouth daily. 90 tablet 3   metFORMIN (GLUCOPHAGE) 500 MG tablet Take 1 tablet (500 mg total) by mouth daily with breakfast. 90 tablet 1   amoxicillin-clavulanate (AUGMENTIN) 875-125 MG tablet Take 1 tablet by mouth 2 (two) times daily. (Patient not taking: Reported on 12/30/2022) 20 tablet 0   pravastatin (PRAVACHOL) 20 MG tablet Take 1 tablet (20 mg total) by mouth daily. (Patient not taking: Reported on 07/01/2022) 90 tablet 3   No facility-administered medications prior to visit.    No Known Allergies     Objective:    BP 125/83   Pulse 77   Ht 6\' 1"  (1.854 m)   Wt 251 lb 6.4 oz (114 kg)   SpO2 97%   BMI 33.17 kg/m  Wt Readings from Last 3 Encounters:  12/30/22 251 lb 6.4 oz (114 kg)  07/10/22 265 lb (120.2 kg)  07/01/22 264 lb 6.4 oz (119.9 kg)    Physical Exam Vitals and nursing note reviewed.  Constitutional:      Appearance: He is well-developed.  HENT:     Head: Normocephalic and atraumatic.  Cardiovascular:     Rate and Rhythm: Normal rate and regular rhythm.     Heart sounds: Normal heart sounds. No murmur heard.    No friction rub. No gallop.  Pulmonary:     Effort: Pulmonary effort is normal. No tachypnea or respiratory distress.     Breath sounds: Normal breath sounds. No decreased breath sounds, wheezing, rhonchi or rales.  Chest:     Chest wall: No tenderness.  Abdominal:     General: Bowel sounds are normal.     Palpations: Abdomen is soft.  Musculoskeletal:        General: Normal range of motion.     Cervical back: Normal range of motion.  Skin:    General: Skin is warm and dry.   Neurological:     Mental Status: He is alert and oriented to person, place, and time.     Coordination: Coordination normal.  Psychiatric:        Behavior: Behavior normal. Behavior is cooperative.        Thought Content: Thought content normal.        Judgment: Judgment normal.          Patient has been counseled extensively about nutrition and exercise as well as the importance of adherence with medications and regular  follow-up. The patient was given clear instructions to go to ER or return to medical center if symptoms don't improve, worsen or new problems develop. The patient verbalized understanding.   Follow-up: Return in about 6 months (around 07/02/2023).   Claiborne Rigg, FNP-BC [facility] and [facility] [place], [place] [phone]   12/30/2022, 9:51 AM

## 2022-12-31 ENCOUNTER — Other Ambulatory Visit: Payer: Self-pay | Admitting: Nurse Practitioner

## 2022-12-31 DIAGNOSIS — E1165 Type 2 diabetes mellitus with hyperglycemia: Secondary | ICD-10-CM

## 2022-12-31 LAB — RPR: RPR Ser Ql: NONREACTIVE

## 2022-12-31 LAB — CMP14+EGFR
ALT: 22 IU/L (ref 0–44)
AST: 23 IU/L (ref 0–40)
Albumin: 4.1 g/dL (ref 4.1–5.1)
Alkaline Phosphatase: 45 IU/L (ref 44–121)
BUN/Creatinine Ratio: 14 (ref 9–20)
BUN: 19 mg/dL (ref 6–24)
Bilirubin Total: 0.4 mg/dL (ref 0.0–1.2)
CO2: 27 mmol/L (ref 20–29)
Calcium: 9 mg/dL (ref 8.7–10.2)
Chloride: 101 mmol/L (ref 96–106)
Creatinine, Ser: 1.33 mg/dL — ABNORMAL HIGH (ref 0.76–1.27)
Globulin, Total: 2.6 g/dL (ref 1.5–4.5)
Glucose: 128 mg/dL — ABNORMAL HIGH (ref 70–99)
Potassium: 3.6 mmol/L (ref 3.5–5.2)
Sodium: 141 mmol/L (ref 134–144)
Total Protein: 6.7 g/dL (ref 6.0–8.5)
eGFR: 66 mL/min/{1.73_m2} (ref >59)

## 2022-12-31 LAB — MICROALBUMIN / CREATININE URINE RATIO
Creatinine, Urine: 307.1 mg/dL
Microalb/Creat Ratio: 3 mg/g{creat} (ref 0–29)
Microalbumin, Urine: 9.7 ug/mL

## 2022-12-31 LAB — LIPID PANEL
Chol/HDL Ratio: 4.3 ratio (ref 0.0–5.0)
Cholesterol, Total: 232 mg/dL — ABNORMAL HIGH (ref 100–199)
HDL: 54 mg/dL (ref >39)
LDL Chol Calc (NIH): 160 mg/dL — ABNORMAL HIGH (ref 0–99)
Triglycerides: 103 mg/dL (ref 0–149)
VLDL Cholesterol Cal: 18 mg/dL (ref 5–40)

## 2022-12-31 LAB — HIV-1 RNA ULTRAQUANT REFLEX TO GENTYP+
HIV 1 RNA Quant: NOT DETECTED {copies}/mL
HIV-1 RNA Quant, Log: NOT DETECTED {Log_copies}/mL

## 2023-01-10 ENCOUNTER — Ambulatory Visit (INDEPENDENT_AMBULATORY_CARE_PROVIDER_SITE_OTHER): Payer: BC Managed Care – PPO | Admitting: Internal Medicine

## 2023-01-10 ENCOUNTER — Other Ambulatory Visit: Payer: Self-pay

## 2023-01-10 ENCOUNTER — Encounter: Payer: Self-pay | Admitting: Internal Medicine

## 2023-01-10 VITALS — BP 153/93 | HR 74 | Temp 97.5°F | Ht 73.0 in | Wt 255.0 lb

## 2023-01-10 DIAGNOSIS — B20 Human immunodeficiency virus [HIV] disease: Secondary | ICD-10-CM | POA: Diagnosis not present

## 2023-01-10 DIAGNOSIS — Z113 Encounter for screening for infections with a predominantly sexual mode of transmission: Secondary | ICD-10-CM

## 2023-01-10 DIAGNOSIS — N181 Chronic kidney disease, stage 1: Secondary | ICD-10-CM

## 2023-01-10 DIAGNOSIS — M25511 Pain in right shoulder: Secondary | ICD-10-CM | POA: Diagnosis not present

## 2023-01-10 DIAGNOSIS — Z23 Encounter for immunization: Secondary | ICD-10-CM

## 2023-01-10 MED ORDER — ODEFSEY 200-25-25 MG PO TABS
1.0000 | ORAL_TABLET | Freq: Every day | ORAL | 11 refills | Status: DC
Start: 2023-01-10 — End: 2023-03-12

## 2023-01-10 NOTE — Progress Notes (Signed)
   Subjective:    Patient ID: Edward Archer, male    DOB: 09/22/1975, 47 y.o.   MRN: [id number]  HPI Edward Archer is here for follow up of HIV He continues on [facility] and denies any missed doses.  He is having some pain in his right shoulder and taking ibuprofen for it.  Has been about 1 week.  He has greatly improved his Hgb A1c, down to 6.4.  he checks his blood pressure at home.     Review of Systems  Constitutional:  Negative for fatigue.  Gastrointestinal:  Negative for diarrhea.  Skin:  Negative for rash.       Objective:   Physical Exam Eyes:     General: No scleral icterus. Pulmonary:     Effort: Pulmonary effort is normal.  Neurological:     Mental Status: He is alert.   SH; no tobacco        Assessment & Plan:

## 2023-01-10 NOTE — Assessment & Plan Note (Signed)
He continues to do well and labs reviewed with him.  No concerns.   Follow up in 6 months.

## 2023-01-10 NOTE — Assessment & Plan Note (Signed)
Recent creat stable.  I advised to limit ibuprofen as above.

## 2023-01-10 NOTE — Assessment & Plan Note (Signed)
New issue.  Able to use his shoulder ok with good rom.   If it persists, will need to see his pcp Discussed ibuprofen and to limit it with his renal function.

## 2023-01-10 NOTE — Assessment & Plan Note (Signed)
Screened negative 

## 2023-02-17 ENCOUNTER — Telehealth: Payer: Self-pay | Admitting: Nurse Practitioner

## 2023-02-17 ENCOUNTER — Other Ambulatory Visit: Payer: Self-pay

## 2023-02-17 NOTE — Telephone Encounter (Signed)
[facility] called and spoke to Edward Archer, Pensions consultant about the refill(s) Metformin 500 mg requested. Advised it was sent on 12/30/22 #90/1 refill(s). She says it's on file and the insurance is requiring a PA, call [phone] for that authorization. It shows in the system last picked up 90 in May.

## 2023-02-17 NOTE — Telephone Encounter (Signed)
Spoke to [employer], Metformin does not require a prior authorization-patient's ins only allows 2 fills at a retail pharmacy and then patient has to utilize mail order pharmacy services. Patient has been informed and advised to contact his benefits coordinator to determine which mail order pharmacy he will need the rx sent to. Patient also advised that he has the option of purchasing out-of-pocket and his ins may allow a 1 time override for 1 extra fill at a retail pharmacy.

## 2023-02-17 NOTE — Telephone Encounter (Signed)
Medication Refill - Medication: metFORMIN (GLUCOPHAGE) 500 MG tablet   Pt reports that when he initially received this recent supply they only gave him 30 tablets instead of 90. He is now currently out.   Has the patient contacted their pharmacy? Yes.   (Agent: If no, request that the patient contact the pharmacy for the refill. If patient does not wish to contact the pharmacy document the reason why and proceed with request.) (Agent: If yes, when and what did the pharmacy advise?)  Preferred Pharmacy (with phone number or street name):  [id number] - , [place] - [address] AT [facility] [address]  [address] [place] [address]  Phone: [phone] Fax: [phone]   Has the patient been seen for an appointment in the last year OR does the patient have an upcoming appointment? Yes.    Agent: Please be advised that RX refills may take up to 3 business days. We ask that you follow-up with your pharmacy.

## 2023-03-12 ENCOUNTER — Other Ambulatory Visit: Payer: Self-pay | Admitting: Nurse Practitioner

## 2023-03-12 ENCOUNTER — Other Ambulatory Visit: Payer: Self-pay | Admitting: Internal Medicine

## 2023-03-12 DIAGNOSIS — B20 Human immunodeficiency virus [HIV] disease: Secondary | ICD-10-CM

## 2023-03-12 DIAGNOSIS — I10 Essential (primary) hypertension: Secondary | ICD-10-CM

## 2023-03-12 DIAGNOSIS — E1165 Type 2 diabetes mellitus with hyperglycemia: Secondary | ICD-10-CM

## 2023-03-12 DIAGNOSIS — E7800 Pure hypercholesterolemia, unspecified: Secondary | ICD-10-CM

## 2023-03-12 NOTE — Telephone Encounter (Signed)
Medication Refill - Medication: metFORMIN (GLUCOPHAGE) 500 MG tablet amLODipine (NORVASC) 10 MG tablet hydrochlorothiazide (HYDRODIURIL) 25 MG tablet [[id number]] pravastatin (PRAVACHOL) 20 MG tablet [[id number]]   Has the patient contacted their pharmacy? Yes.     (Agent: If yes, when and what did the pharmacy advise?) Contact office   Preferred Pharmacy (with phone number or street name): [facility] - [place], [place] - [address]   Has the patient been seen for an appointment in the last year OR does the patient have an upcoming appointment? Yes.    Agent: Please be advised that RX refills may take up to 3 business days. We ask that you follow-up with your pharmacy.

## 2023-03-13 MED ORDER — HYDROCHLOROTHIAZIDE 25 MG PO TABS
25.0000 mg | ORAL_TABLET | Freq: Every day | ORAL | 1 refills | Status: DC
Start: 2023-03-13 — End: 2023-12-23

## 2023-03-13 MED ORDER — AMLODIPINE BESYLATE 10 MG PO TABS: ORAL_TABLET | ORAL | 1 refills | Status: DC

## 2023-03-13 MED ORDER — METFORMIN HCL 500 MG PO TABS
500.0000 mg | ORAL_TABLET | Freq: Every day | ORAL | 1 refills | Status: DC
Start: 2023-03-13 — End: 2023-12-23

## 2023-03-13 NOTE — Telephone Encounter (Signed)
Requested Prescriptions  Pending Prescriptions Disp Refills   metFORMIN (GLUCOPHAGE) 500 MG tablet 90 tablet 1    Sig: Take 1 tablet (500 mg total) by mouth daily with breakfast.     Endocrinology:  Diabetes - Biguanides Failed - 03/12/2023  1:24 PM      Failed - Cr in normal range and within 360 days    Creat  Date Value Ref Range Status  12/10/2021 1.48 (H) 0.60 - 1.29 mg/dL Final   Creatinine, Ser  Date Value Ref Range Status  12/30/2022 1.33 (H) 0.76 - 1.27 mg/dL Final         Failed - B12 Level in normal range and within 720 days    No results found for: "VITAMINB12"       Passed - HBA1C is between 0 and 7.9 and within 180 days    HbA1c, POC (controlled diabetic range)  Date Value Ref Range Status  12/30/2022 6.4 0.0 - 7.0 % Final         Passed - eGFR in normal range and within 360 days    GFR, Est African American  Date Value Ref Range Status  11/01/2020 86 > OR = 60 mL/min/1.57m2 Final   GFR, Est Non African American  Date Value Ref Range Status  11/01/2020 74 > OR = 60 mL/min/1.96m2 Final   eGFR  Date Value Ref Range Status  12/30/2022 66 >59 mL/min/1.73 Final         Passed - Valid encounter within last 6 months    Recent Outpatient Visits           2 months ago Type 2 diabetes mellitus with hyperglycemia, without long-term current use of insulin (HCC)   [place] [facility] [place], Shea Stakes, NP   8 months ago Primary hypertension   [place] [facility] [place], Shea Stakes, NP   1 year ago Primary hypertension   [place] [facility] [place], Shea Stakes, NP   1 year ago Primary hypertension   [place] [facility] [place], Cornelius Moras, RPH-CPP   1 year ago Primary hypertension    [place] [facility] Claiborne Rigg, NP       Future Appointments             In 3 months Claiborne Rigg, NP [facility]   In 3 months [place], Belia Heman, MD [facility] for Infectious Disease, [facility]            Passed - CBC within normal limits and completed in the last 12 months    WBC  Date Value Ref Range Status  07/01/2022 6.8 3.4 - 10.8 x10E3/uL Final  12/10/2021 6.4 3.8 - 10.8 Thousand/uL Final   RBC  Date Value Ref Range Status  07/01/2022 5.38 4.14 - 5.80 x10E6/uL Final  12/10/2021 5.15 4.20 - 5.80 Million/uL Final   Hemoglobin  Date Value Ref Range Status  07/01/2022 15.3 13.0 - 17.7 g/dL Final   Hematocrit  Date Value Ref Range Status  07/01/2022 46.5 37.5 - 51.0 % Final   MCHC  Date Value Ref Range Status  07/01/2022 32.9 31.5 - 35.7 g/dL Final  11/91/4782 95.6 32.0 - 36.0 g/dL Final   [facility]  Date Value Ref Range Status  07/01/2022 28.4 26.6 - 33.0 pg Final  12/10/2021 29.7 27.0 - 33.0 pg Final   MCV  Date Value Ref Range Status  07/01/2022 86 79 - 97 fL Final   No results found for: "PLTCOUNTKUC", "LABPLAT", "POCPLA" RDW  Date Value Ref Range Status  07/01/2022 13.5 11.6 - 15.4 % Final          hydrochlorothiazide (HYDRODIURIL) 25 MG tablet 90 tablet 1    Sig: Take 1 tablet (25 mg total) by mouth daily.     Cardiovascular: Diuretics - Thiazide Failed - 03/12/2023  1:24 PM      Failed - Cr in normal range and within 180 days    Creat  Date Value Ref Range Status  12/10/2021 1.48 (H) 0.60 - 1.29 mg/dL Final   Creatinine, Ser  Date Value Ref Range Status  12/30/2022 1.33 (H) 0.76 - 1.27 mg/dL Final         Failed - Last BP in normal range    BP Readings from Last 1 Encounters:  01/10/23 (!) 153/93         Passed - K in normal range and within 180 days    Potassium  Date Value Ref Range Status  12/30/2022 3.6 3.5 - 5.2 mmol/L Final         Passed - Na in normal range and within 180 days    Sodium  Date Value Ref Range Status  12/30/2022 141 134 - 144 mmol/L Final         Passed - Valid encounter within last 6 months     Recent Outpatient Visits           2 months ago Type 2 diabetes mellitus with hyperglycemia, without long-term current use of insulin (HCC)   [place] [facility] [place], Shea Stakes, NP   8 months ago Primary hypertension   [place] [facility] [place], Shea Stakes, NP   1 year ago Primary hypertension   [place] [facility] [place], Shea Stakes, NP   1 year ago Primary hypertension   [place] [facility] [place], Cornelius Moras, RPH-CPP   1 year ago Primary hypertension   [place] [facility] [place], Shea Stakes, NP       Future Appointments             In 3 months Claiborne Rigg, NP [place] [facility]   In 3 months [place], Belia Heman, MD [facility] for Infectious Disease, [facility]             amLODipine (NORVASC) 10 MG tablet 90 tablet 1    Sig: TAKE 1 TABLET(10 MG) BY MOUTH DAILY     Cardiovascular: Calcium Channel Blockers 2 Failed - 03/12/2023  1:24 PM      Failed - Last BP in normal range    BP Readings from Last 1 Encounters:  01/10/23 (!) 153/93         Passed - Last Heart Rate in normal range    Pulse Readings from Last 1 Encounters:  01/10/23 74         Passed - Valid encounter within last 6 months    Recent Outpatient Visits           2 months ago Type 2 diabetes mellitus with hyperglycemia, without long-term current use of insulin [facility])   [place] [facility] [place], Shea Stakes, NP   8 months ago Primary hypertension   [facility] [place], Shea Stakes, NP   1 year ago Primary hypertension   [place] [facility] [place], Shea Stakes, NP   1 year ago Primary hypertension   [facility] [place], RPH-CPP   1 year ago Primary hypertension   [place] [facility] Claiborne Rigg, NP       Future Appointments             In 3 months Claiborne Rigg, NP [facility] [place]   In 3 months [place], Belia Heman, MD [facility] for Infectious Disease, [facility]            Refused Prescriptions Disp Refills   pravastatin (PRAVACHOL) 20 MG tablet 90 tablet 3    Sig: Take 1 tablet (20 mg total) by mouth daily.     Cardiovascular:  Antilipid - Statins Failed - 03/12/2023  1:24 PM      Failed - Lipid Panel in normal range within the last 12 months    Cholesterol, Total  Date Value Ref Range Status  12/30/2022 232 (H) 100 - 199 mg/dL Final   LDL Cholesterol (Calc)  Date Value Ref Range Status  12/10/2021 162 (H) mg/dL (calc) Final    Comment:    Reference range: <100 . Desirable range <100 mg/dL for primary prevention;   <70 mg/dL for patients with CHD or diabetic patients  with > or = 2 CHD risk factors. Marland Kitchen LDL-C is now calculated using the Martin-Hopkins  calculation, which is a validated novel method providing  better accuracy than the Friedewald equation in the  estimation of LDL-C.  Horald Pollen et al. Lenox Ahr. 4098;119(14): 2061-2068  (http://education.QuestDiagnostics.com/faq/FAQ164)    LDL Chol Calc (NIH)  Date Value Ref Range Status  12/30/2022 160 (H) 0 - 99 mg/dL Final   HDL  Date Value Ref Range Status  12/30/2022 54 >39 mg/dL Final   Triglycerides  Date Value Ref Range Status  12/30/2022 103 0 - 149 mg/dL Final         Passed - Patient is not pregnant      Passed - Valid encounter within last 12 months    Recent Outpatient Visits           2 months ago Type 2 diabetes mellitus with hyperglycemia, without long-term current use of insulin [facility])   [place] [facility] [place], Shea Stakes, NP   8 months ago Primary hypertension   [place] [facility] [place], Shea Stakes, NP   1 year ago Primary hypertension   [place]  [place], Shea Stakes, NP   1 year ago Primary hypertension   [place] [facility] [place], Cornelius Moras, RPH-CPP   1 year ago Primary hypertension   [place] [facility] [place], Shea Stakes, NP       Future Appointments             In 3 months Claiborne Rigg, NP [facility]   In 3 months [place], Belia Heman, MD [facility] for Infectious Disease, [facility]

## 2023-04-15 ENCOUNTER — Encounter (HOSPITAL_COMMUNITY): Payer: Self-pay

## 2023-04-15 ENCOUNTER — Emergency Department (HOSPITAL_COMMUNITY)
Admission: EM | Admit: 2023-04-15 | Discharge: 2023-04-15 | Disposition: A | Discharge status: Home / Self Care | Payer: BC Managed Care – PPO | Attending: Emergency Medicine | Admitting: Emergency Medicine

## 2023-04-15 ENCOUNTER — Other Ambulatory Visit: Payer: Self-pay

## 2023-04-15 ENCOUNTER — Emergency Department (HOSPITAL_COMMUNITY): Payer: BC Managed Care – PPO

## 2023-04-15 DIAGNOSIS — Z7984 Long term (current) use of oral hypoglycemic drugs: Secondary | ICD-10-CM | POA: Insufficient documentation

## 2023-04-15 DIAGNOSIS — Z21 Asymptomatic human immunodeficiency virus [HIV] infection status: Secondary | ICD-10-CM | POA: Insufficient documentation

## 2023-04-15 DIAGNOSIS — M25521 Pain in right elbow: Secondary | ICD-10-CM

## 2023-04-15 DIAGNOSIS — M5431 Sciatica, right side: Secondary | ICD-10-CM

## 2023-04-15 DIAGNOSIS — N189 Chronic kidney disease, unspecified: Secondary | ICD-10-CM | POA: Insufficient documentation

## 2023-04-15 DIAGNOSIS — I129 Hypertensive chronic kidney disease with stage 1 through stage 4 chronic kidney disease, or unspecified chronic kidney disease: Secondary | ICD-10-CM | POA: Insufficient documentation

## 2023-04-15 MED ORDER — HYDROCODONE-ACETAMINOPHEN 5-325 MG PO TABS
1.0000 | ORAL_TABLET | Freq: Four times a day (QID) | ORAL | 0 refills | Status: DC | PRN
Start: 2023-04-15 — End: 2023-07-02

## 2023-04-15 MED ORDER — HYDROMORPHONE HCL 1 MG/ML IJ SOLN
1.0000 mg | Freq: Once | INTRAMUSCULAR | Status: AC
  Administered 2023-04-15: 1 mg via INTRAMUSCULAR
  Filled 2023-04-15: qty 1

## 2023-04-15 MED ORDER — PREDNISONE 50 MG PO TABS: 50.0000 mg | ORAL_TABLET | Freq: Every day | ORAL | 0 refills | Status: DC

## 2023-04-15 NOTE — Discharge Instructions (Signed)
Take the medications as needed for pain and discomfort.  Consider following up with an orthopedic doctor for further evaluation

## 2023-04-15 NOTE — ED Triage Notes (Signed)
Pt arrived reporting lower back pain radiating down right leg that started on Sunday. Atraumatic. States he took muscle relaxer, no  relief. Also endorses R arm pain that is not new. Denies any numbness, tingling or any changes with bowel/bladder

## 2023-04-15 NOTE — ED Provider Notes (Signed)
[place] EMERGENCY DEPARTMENT AT [facility] Provider Note   CSN: [id number] Arrival date & time: 04/15/23  0900     History  Chief Complaint  Patient presents with   Back Pain   Leg Pain    Edward Archer is a 47 y.o. male.   Back Pain Associated symptoms: leg pain   Leg Pain Associated symptoms: back pain      Patient has a history of hyperlipidemia HIV, chronic kidney disease, hypertension.  Patient presents with complaints of pain in his right elbow as well as his right hip and back.  Pain started sometime last week.  He does not recall any falls or injuries.  Patient states pain to his back does move down towards his right hip.  He has tried some muscle relaxers without relief.  He also has noticed some discomfort in his right elbow area.  That is less severe than his back.  Patient states it is because increasing pain and discomfort and it is hard for him to get around so he came to the ED.  Home Medications Prior to Admission medications   Medication Sig Start Date End Date Taking? Authorizing Provider  HYDROcodone-acetaminophen (NORCO/VICODIN) 5-325 MG tablet Take 1 tablet by mouth every 6 (six) hours as needed. 04/15/23  Yes Linwood Dibbles, MD  predniSONE (DELTASONE) 50 MG tablet Take 1 tablet (50 mg total) by mouth daily. 04/15/23  Yes Linwood Dibbles, MD  amLODipine (NORVASC) 10 MG tablet TAKE 1 TABLET(10 MG) BY MOUTH DAILY 03/13/23   Claiborne Rigg, NP  Blood Glucose Monitoring Suppl DEVI 1 each by Does not apply route 2 (two) times daily. May substitute to any manufacturer covered by patient's insurance. 07/15/22   Claiborne Rigg, NP  Brimonidine Tartrate (LUMIFY OP) Apply 1 drop to eye daily as needed.    [provider]  Glucose Blood (BLOOD GLUCOSE TEST STRIPS) STRP 1 each by In Vitro route 2 (two) times daily. May substitute to any manufacturer covered by patient's insurance. 07/15/22   Claiborne Rigg, NP  hydrochlorothiazide (HYDRODIURIL) 25 MG tablet  Take 1 tablet (25 mg total) by mouth daily. 03/13/23   Claiborne Rigg, NP  metFORMIN (GLUCOPHAGE) 500 MG tablet Take 1 tablet (500 mg total) by mouth daily with breakfast. 03/13/23   Claiborne Rigg, NP  ODEFSEY 200-25-25 MG TABS tablet TAKE 1 TABLET BY MOUTH DAILY 03/12/23   Gardiner Barefoot, MD  pravastatin (PRAVACHOL) 20 MG tablet Take 1 tablet (20 mg total) by mouth daily. 12/30/22   Claiborne Rigg, NP      Allergies    Patient has no known allergies.    Review of Systems   Review of Systems  Musculoskeletal:  Positive for back pain.    Physical Exam Updated Vital Signs BP (!) 146/103 (BP Location: Left Arm)   Pulse 85   Temp 98.3 F (36.8 C) (Oral)   Resp 18   Ht 1.854 m (6\' 1" )   Wt 115.7 kg   SpO2 100%   BMI 33.65 kg/m  Physical Exam Vitals and nursing note reviewed.  Constitutional:      Appearance: He is well-developed. He is not diaphoretic.  HENT:     Head: Normocephalic and atraumatic.     Right Ear: External ear normal.     Left Ear: External ear normal.  Eyes:     General: No scleral icterus.       Right eye: No discharge.  Left eye: No discharge.     Conjunctiva/sclera: Conjunctivae normal.  Neck:     Trachea: No tracheal deviation.  Cardiovascular:     Rate and Rhythm: Normal rate and regular rhythm.  Pulmonary:     Effort: Pulmonary effort is normal. No respiratory distress.     Breath sounds: Normal breath sounds. No stridor. No wheezing or rales.  Abdominal:     General: Bowel sounds are normal. There is no distension.     Palpations: Abdomen is soft.     Tenderness: There is no abdominal tenderness. There is no guarding or rebound.  Musculoskeletal:        General: Tenderness present. No deformity.     Cervical back: Neck supple.     Comments: Mild tenderness palpation right elbow.  No effusion no swelling.  Full range of motion; tenderness palpation paraspinal muscles and lumbar spine, mild tenderness palpation right hip, pain  increases with range of motion  Skin:    General: Skin is warm and dry.     Findings: No rash.  Neurological:     General: No focal deficit present.     Mental Status: He is alert.     Cranial Nerves: No cranial nerve deficit, dysarthria or facial asymmetry.     Sensory: No sensory deficit.     Motor: No abnormal muscle tone or seizure activity.     Coordination: Coordination normal.     Comments: Normal strength and sensation bilateral upper extremities and lower extremities  Psychiatric:        Mood and Affect: Mood normal.     ED Results / Procedures / Treatments   Labs (all labs ordered are listed, but only abnormal results are displayed) Labs Reviewed - No data to display  EKG None  Radiology DG Lumbar Spine Complete  Result Date: 04/15/2023 CLINICAL DATA:  47 year old male with pain. No known injury. EXAM: LUMBAR SPINE - COMPLETE 4+ VIEW COMPARISON:  CT Abdomen and Pelvis 09/30/2019. FINDINGS: Normal lumbar segmentation. Mild straightening of lumbar lordosis. Maintained vertebral height, disc space height. No acute osseous abnormality identified. No pars fracture. No age advanced facet degeneration. Visible sacrum and SI joints appear intact. IMPRESSION: Normal for age radiographic appearance of the lumbar spine. Electronically Signed   By: Odessa Fleming M.D.   On: 04/15/2023 12:16   DG Hip Unilat With Pelvis 2-3 Views Right  Result Date: 04/15/2023 CLINICAL DATA:  47 year old male with pain.  No known injury. EXAM: DG HIP (WITH OR WITHOUT PELVIS) 2-3V RIGHT COMPARISON:  CT Abdomen and Pelvis 09/30/2019. FINDINGS: Bone mineralization is within normal limits. Mild chronic asymmetric right acetabular degenerative spurring or ossification is stable since 2021. Femoral heads are normally located. Hip joint spaces are symmetric and within normal limits. Pelvis intact. SI joints appear symmetric and normal. Negative lower abdominal and pelvic visceral contours. Grossly intact proximal left  femur. Proximal right femur appears intact and negative. IMPRESSION: 1. No acute osseous abnormality identified about the right hip or pelvis. 2. Minor chronic right acetabular degeneration appears stable since 2021. Electronically Signed   By: Odessa Fleming M.D.   On: 04/15/2023 12:15   DG Elbow Complete Right  Result Date: 04/15/2023 CLINICAL DATA:  Right elbow pain.  No known injury. EXAM: RIGHT ELBOW - COMPLETE 3 VIEW COMPARISON:  None Available. FINDINGS: There is no evidence of fracture, dislocation, or joint effusion. There is no evidence of arthropathy or other focal bone abnormality. Soft tissues are unremarkable. IMPRESSION: No focal radiographic  abnormality. Electronically Signed   By: Agustin Cree M.D.   On: 04/15/2023 12:14    Procedures Procedures    Medications Ordered in ED Medications  HYDROmorphone (DILAUDID) injection 1 mg (1 mg Intramuscular Given 04/15/23 1110)    ED Course/ Medical Decision Making/ A&P Clinical Course as of 04/15/23 1246  Tue Apr 15, 2023  1227 Elbow and lumbar spine x-rays without acute abnormality.  Hip x-ray showed minor acetabular abnormality unchanged compared to previous [JK]    Clinical Course User Index [JK] Linwood Dibbles, MD                                 Medical Decision Making Problems Addressed: Elbow pain, right: acute illness or injury Sciatica of right side: acute illness or injury  Amount and/or Complexity of Data Reviewed Radiology: ordered and independent interpretation performed.  Risk Prescription drug management.   Patient presented ED with complaints of elbow pain as well as back pain.  Elbow exam without signs of any swelling or erythema.  X-rays do not show any signs of fracture.  It is possible patient may be having some symptoms of tendinitis.  Patient also complaining of lower back pain.  He does have a component suggestive of sciatica.  No acute weakness or numbness noted on exam.  X-rays do not show any acute abnormality.   Will try him on a course of steroids and short course of hydrocodone considering his chronic kidney disease.  Recommend outpatient follow-up with orthopedics.        Final Clinical Impression(s) / ED Diagnoses Final diagnoses:  Elbow pain, right  Sciatica of right side    Rx / DC Orders ED Discharge Orders          Ordered    predniSONE (DELTASONE) 50 MG tablet  Daily        04/15/23 1246    HYDROcodone-acetaminophen (NORCO/VICODIN) 5-325 MG tablet  Every 6 hours PRN        04/15/23 1246              Linwood Dibbles, MD 04/15/23 1248

## 2023-06-12 ENCOUNTER — Other Ambulatory Visit (HOSPITAL_COMMUNITY): Payer: Self-pay

## 2023-06-12 ENCOUNTER — Telehealth: Payer: Self-pay

## 2023-06-12 NOTE — Telephone Encounter (Signed)
Pharmacy Patient Advocate Encounter   Received notification from CoverMyMeds that prior authorization for ODEFSEY is required/requested.   Insurance verification completed.   The patient is insured through [facility] .   Per test claim: PA required; PA submitted to above mentioned insurance via CoverMyMeds Key/confirmation #/EOC [id number] Status is pending

## 2023-06-12 NOTE — Telephone Encounter (Addendum)
Pharmacy Patient Advocate Encounter  Received notification from [facility] that Prior Authorization for ODEFSEY has been CANCELLED by the processor we sent it to: Resubmitted through Prompt PA to [facility]   PA #/Case ID/Reference #: [id number]

## 2023-06-12 NOTE — Telephone Encounter (Signed)
Pharmacy Patient Advocate Encounter  Received notification from [facility] that Prior Authorization for ODEFSEY has been APPROVED from 06/12/23 to 06/10/24   PA #/Case ID/Reference #: [id number]

## 2023-06-20 ENCOUNTER — Other Ambulatory Visit: Payer: Self-pay

## 2023-06-20 ENCOUNTER — Other Ambulatory Visit: Payer: BC Managed Care – PPO

## 2023-06-20 DIAGNOSIS — B20 Human immunodeficiency virus [HIV] disease: Secondary | ICD-10-CM

## 2023-06-24 LAB — HIV-1 RNA QUANT-NO REFLEX-BLD
HIV 1 RNA Quant: NOT DETECTED {copies}/mL
HIV-1 RNA Quant, Log: NOT DETECTED {Log}

## 2023-06-24 LAB — T-HELPER CELLS (CD4) COUNT (NOT AT ARMC)
Absolute CD4: 1196 {cells}/uL (ref 490–1740)
CD4 T Helper %: 41 % (ref 30–61)
Total lymphocyte count: 2916 {cells}/uL (ref 850–3900)

## 2023-07-02 ENCOUNTER — Encounter: Payer: Self-pay | Admitting: Nurse Practitioner

## 2023-07-02 ENCOUNTER — Ambulatory Visit: Payer: No Typology Code available for payment source | Attending: Nurse Practitioner | Admitting: Nurse Practitioner

## 2023-07-02 VITALS — BP 127/78 | HR 81 | Ht 73.0 in | Wt 255.8 lb

## 2023-07-02 DIAGNOSIS — Z7984 Long term (current) use of oral hypoglycemic drugs: Secondary | ICD-10-CM | POA: Diagnosis not present

## 2023-07-02 DIAGNOSIS — E7800 Pure hypercholesterolemia, unspecified: Secondary | ICD-10-CM

## 2023-07-02 DIAGNOSIS — I10 Essential (primary) hypertension: Secondary | ICD-10-CM

## 2023-07-02 DIAGNOSIS — E1165 Type 2 diabetes mellitus with hyperglycemia: Secondary | ICD-10-CM | POA: Diagnosis not present

## 2023-07-02 LAB — POCT GLYCOSYLATED HEMOGLOBIN (HGB A1C): Hemoglobin A1C: 7 % — AB (ref 4.0–5.6)

## 2023-07-02 NOTE — Progress Notes (Signed)
 Assessment & Plan:  Bransen was seen today for medical management of chronic issues.  Diagnoses and all orders for this visit:  Primary hypertension Continue amlodipine and hydrochlorothiazide  as prescribed.  Reminded to bring in blood pressure log for follow  up appointment.  RECOMMENDATIONS: DASH/Mediterranean Diets are healthier choices for HTN.     Type 2 diabetes mellitus with hyperglycemia, without long-term current use of insulin (HCC) -     POCT glycosylated hemoglobin (Hb A1C) -     CMP14+EGFR Continue blood sugar control as discussed in office today, low carbohydrate diet, and regular physical exercise as tolerated, 150 minutes per week (30 min each day, 5 days per week, or 50 min 3 days per week). Keep blood sugar logs with fasting goal of 90-130 mg/dl, post prandial (after you eat) less than 180.  For Hypoglycemia: BS <60 and Hyperglycemia BS >400; contact the clinic ASAP. Annual eye exams and foot exams are recommended.   Hypercholesterolemia -     Lipid panel INSTRUCTIONS: Work on a low fat, heart healthy diet and participate in regular aerobic exercise program by working out at least 150 minutes per week; 5 days a week-30 minutes per day. Avoid red meat/beef/steak,  fried foods. junk foods, sodas, sugary drinks, unhealthy snacking, alcohol and smoking.  Drink at least 80 oz of water per day and monitor your carbohydrate intake daily.      Patient has been counseled on age-appropriate routine health concerns for screening and prevention. These are reviewed and up-to-date. Referrals have been placed accordingly. Immunizations are up-to-date or declined.    Subjective:   Chief Complaint  Patient presents with   Medical Management of Chronic Issues    Edward Archer 48 y.o. 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       HTN Blood pressure is well controlled. He is taking HCTZ 25 mg daily and amlodipine 10 mg daily as prescribed. Home blood pressure readings are at goal of <130/80 BP Readings from Last 3 Encounters:  07/02/23 127/78  04/15/23 (!) 130/96  01/10/23 (!) 153/93    DM 2 A1c increased. He has not been taking metformin as prescribed. Forgetting to take daily.  Lab Results  Component Value Date   HGBA1C 7.0 (A) 07/02/2023     Review of Systems  Constitutional:  Negative for fever, malaise/fatigue and weight loss.  HENT: Negative.  Negative for nosebleeds.   Eyes: Negative.  Negative for blurred vision, double vision and photophobia.  Respiratory: Negative.  Negative for cough and shortness of breath.   Cardiovascular: Negative.  Negative for chest pain, palpitations and leg swelling.  Gastrointestinal: Negative.  Negative for heartburn, nausea and vomiting.  Musculoskeletal: Negative.  Negative for myalgias.  Neurological: Negative.  Negative for dizziness, focal weakness, seizures and headaches.  Psychiatric/Behavioral: Negative.  Negative for suicidal ideas.     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   Outpatient Medications Prior to Visit  Medication Sig Dispense Refill   amLODipine (NORVASC) 10 MG tablet TAKE 1 TABLET(10 MG) BY MOUTH DAILY 90 tablet 1   Blood Glucose Monitoring Suppl DEVI 1 each by Does not apply  route 2 (two) times daily. May substitute to any manufacturer covered by patient's insurance. 1 each 0   Brimonidine Tartrate (LUMIFY OP) Apply 1 drop to eye daily as needed.     Glucose Blood (BLOOD GLUCOSE TEST STRIPS) STRP 1 each by In Vitro route 2 (two) times daily. May substitute to any manufacturer covered by patient's insurance. 100 strip 6   hydrochlorothiazide (HYDRODIURIL) 25 MG tablet Take 1 tablet (25 mg total) by mouth daily. 90 tablet 1   metFORMIN (GLUCOPHAGE) 500 MG tablet Take 1 tablet (500 mg total) by mouth daily with breakfast. 90 tablet 1   ODEFSEY 200-25-25 MG TABS tablet TAKE 1 TABLET BY MOUTH DAILY 30 tablet 4   pravastatin (PRAVACHOL) 20 MG tablet Take 1 tablet (20 mg total) by mouth daily. 90 each 3   HYDROcodone-acetaminophen (NORCO/VICODIN) 5-325 MG tablet Take 1 tablet by mouth every 6 (six) hours as needed. (Patient not taking: Reported on 07/02/2023) 12 tablet 0   predniSONE (DELTASONE) 50 MG tablet Take 1 tablet (50 mg total) by mouth daily. (Patient not taking: Reported on 07/02/2023) 5 tablet 0   No facility-administered medications prior to visit.    No Known Allergies     Objective:    BP 127/78 (BP Location: Left Arm, Patient Position: Sitting, Cuff Size: Normal)   Pulse 81   Ht 6\' 1"  (1.854 m)   Wt 255 lb 12.8 oz (116 kg)   BMI 33.75 kg/m  Wt Readings from Last 3 Encounters:  07/02/23 255 lb 12.8 oz (116 kg)  04/15/23 255 lb 1.2 oz (115.7 kg)  01/10/23 255 lb (115.7 kg)    Physical Exam Vitals and nursing note reviewed.  Constitutional:      Appearance: He is well-developed.  HENT:     Head: Normocephalic and atraumatic.  Cardiovascular:     Rate and Rhythm: Normal rate and regular rhythm.     Heart sounds: Normal heart sounds. No murmur heard.    No friction rub. No gallop.  Pulmonary:     Effort: Pulmonary effort is normal. No tachypnea or respiratory distress.     Breath sounds: Normal breath sounds. No decreased breath sounds,  wheezing, rhonchi or rales.  Chest:     Chest wall: No tenderness.  Abdominal:     General: Bowel sounds are normal.     Palpations: Abdomen is soft.  Musculoskeletal:        General: Normal range of motion.     Cervical back: Normal range of motion.  Skin:    General: Skin is warm and dry.  Neurological:     Mental Status: He is alert and oriented to person, place, and time.     Coordination: Coordination normal.  Psychiatric:        Behavior: Behavior normal. Behavior is cooperative.        Thought Content: Thought content normal.        Judgment: Judgment normal.          Patient has been counseled extensively about nutrition and exercise as well  as the importance of adherence with medications and regular follow-up. The patient was given clear instructions to go to ER or return to medical center if symptoms don't improve, worsen or new problems develop. The patient verbalized understanding.   Follow-up: Return in about 3 months (around 09/29/2023) for DM.   Claiborne Rigg, FNP-BC [facility] [place], [place] [phone]   07/02/2023, 9:43 AM

## 2023-07-03 LAB — CMP14+EGFR
ALT: 24 [IU]/L (ref 0–44)
AST: 19 [IU]/L (ref 0–40)
Albumin: 4.3 g/dL (ref 4.1–5.1)
Alkaline Phosphatase: 53 [IU]/L (ref 44–121)
BUN/Creatinine Ratio: 13 (ref 9–20)
BUN: 18 mg/dL (ref 6–24)
Bilirubin Total: 0.5 mg/dL (ref 0.0–1.2)
CO2: 29 mmol/L (ref 20–29)
Calcium: 9.2 mg/dL (ref 8.7–10.2)
Chloride: 100 mmol/L (ref 96–106)
Creatinine, Ser: 1.43 mg/dL — ABNORMAL HIGH (ref 0.76–1.27)
Globulin, Total: 2.5 g/dL (ref 1.5–4.5)
Glucose: 134 mg/dL — ABNORMAL HIGH (ref 70–99)
Potassium: 4.1 mmol/L (ref 3.5–5.2)
Sodium: 142 mmol/L (ref 134–144)
Total Protein: 6.8 g/dL (ref 6.0–8.5)
eGFR: 60 mL/min/{1.73_m2} (ref >59)

## 2023-07-03 LAB — LIPID PANEL
Chol/HDL Ratio: 4.1 {ratio} (ref 0.0–5.0)
Cholesterol, Total: 220 mg/dL — ABNORMAL HIGH (ref 100–199)
HDL: 54 mg/dL (ref >39)
LDL Chol Calc (NIH): 151 mg/dL — ABNORMAL HIGH (ref 0–99)
Triglycerides: 82 mg/dL (ref 0–149)
VLDL Cholesterol Cal: 15 mg/dL (ref 5–40)

## 2023-07-04 ENCOUNTER — Ambulatory Visit (INDEPENDENT_AMBULATORY_CARE_PROVIDER_SITE_OTHER): Payer: BC Managed Care – PPO | Admitting: Internal Medicine

## 2023-07-04 ENCOUNTER — Encounter: Payer: Self-pay | Admitting: Internal Medicine

## 2023-07-04 ENCOUNTER — Other Ambulatory Visit: Payer: Self-pay

## 2023-07-04 VITALS — BP 132/86 | HR 81 | Resp 16 | Ht 73.0 in | Wt 257.0 lb

## 2023-07-04 DIAGNOSIS — E7800 Pure hypercholesterolemia, unspecified: Secondary | ICD-10-CM

## 2023-07-04 DIAGNOSIS — B20 Human immunodeficiency virus [HIV] disease: Secondary | ICD-10-CM

## 2023-07-04 DIAGNOSIS — Z23 Encounter for immunization: Secondary | ICD-10-CM

## 2023-07-04 DIAGNOSIS — Z113 Encounter for screening for infections with a predominantly sexual mode of transmission: Secondary | ICD-10-CM | POA: Diagnosis not present

## 2023-07-04 MED ORDER — ODEFSEY 200-25-25 MG PO TABS: 1.0000 | ORAL_TABLET | Freq: Every day | ORAL | 11 refills | Status: AC

## 2023-07-04 NOTE — Progress Notes (Signed)
   Subjective:    Patient ID: Edward Archer, male    DOB: May 26, 1975, 48 y.o.   MRN: [id number]  HPI Edward Archer is here for follow-up of HIV. He has continued on [facility] and denies any missed doses.  He has had no issues with getting or taking his medication.  No complaints today   Review of Systems  Constitutional:  Negative for fatigue.  Gastrointestinal:  Negative for diarrhea.  Skin:  Negative for rash.       Objective:   Physical Exam Eyes:     General: No scleral icterus. Pulmonary:     Effort: Pulmonary effort is normal.  Neurological:     Mental Status: He is alert.   SH: no tobacco        Assessment & Plan:

## 2023-07-04 NOTE — Assessment & Plan Note (Signed)
Discussed COVID vaccine and given today

## 2023-07-04 NOTE — Assessment & Plan Note (Signed)
 He continues doing well on Odefsey and no changes indicated.  Labs reviewed with him and has a good immune system with a CD4 count over 1000 and a not detected viral load.  He will continue with same and return in 6 months

## 2023-07-04 NOTE — Assessment & Plan Note (Signed)
 He is on a statin with concomitant diabetes as well.  Discussed the reprieve trial Monitored by his primary provider.

## 2023-07-13 ENCOUNTER — Encounter: Payer: Self-pay | Admitting: Nurse Practitioner

## 2023-07-23 ENCOUNTER — Other Ambulatory Visit: Payer: Self-pay | Admitting: Nurse Practitioner

## 2023-07-23 DIAGNOSIS — E1165 Type 2 diabetes mellitus with hyperglycemia: Secondary | ICD-10-CM

## 2023-07-23 DIAGNOSIS — I10 Essential (primary) hypertension: Secondary | ICD-10-CM

## 2023-07-24 NOTE — Telephone Encounter (Signed)
 Requested Prescriptions  Refused Prescriptions Disp Refills   metFORMIN (GLUCOPHAGE) 500 MG tablet [Pharmacy Med Name: metFORMIN HCl 500 MG Oral Tablet] 90 tablet 3    Sig: TAKE 1 TABLET BY MOUTH DAILY  WITH BREAKFAST     Endocrinology:  Diabetes - Biguanides Failed - 07/24/2023 11:58 AM      Failed - Cr in normal range and within 360 days    Creat  Date Value Ref Range Status  12/10/2021 1.48 (H) 0.60 - 1.29 mg/dL Final   Creatinine, Ser  Date Value Ref Range Status  07/02/2023 1.43 (H) 0.76 - 1.27 mg/dL Final         Failed - B12 Level in normal range and within 720 days    No results found for: "VITAMINB12"       Failed - CBC within normal limits and completed in the last 12 months    WBC  Date Value Ref Range Status  07/01/2022 6.8 3.4 - 10.8 x10E3/uL Final  12/10/2021 6.4 3.8 - 10.8 Thousand/uL Final   RBC  Date Value Ref Range Status  07/01/2022 5.38 4.14 - 5.80 x10E6/uL Final  12/10/2021 5.15 4.20 - 5.80 Million/uL Final   Hemoglobin  Date Value Ref Range Status  07/01/2022 15.3 13.0 - 17.7 g/dL Final   Hematocrit  Date Value Ref Range Status  07/01/2022 46.5 37.5 - 51.0 % Final   MCHC  Date Value Ref Range Status  07/01/2022 32.9 31.5 - 35.7 g/dL Final  40/98/1191 47.8 32.0 - 36.0 g/dL Final   [facility]  Date Value Ref Range Status  07/01/2022 28.4 26.6 - 33.0 pg Final  12/10/2021 29.7 27.0 - 33.0 pg Final   MCV  Date Value Ref Range Status  07/01/2022 86 79 - 97 fL Final   No results found for: "PLTCOUNTKUC", "LABPLAT", "POCPLA" RDW  Date Value Ref Range Status  07/01/2022 13.5 11.6 - 15.4 % Final         Passed - HBA1C is between 0 and 7.9 and within 180 days    Hemoglobin A1C  Date Value Ref Range Status  07/02/2023 7.0 (A) 4.0 - 5.6 % Final   HbA1c, POC (controlled diabetic range)  Date Value Ref Range Status  12/30/2022 6.4 0.0 - 7.0 % Final         Passed - eGFR in normal range and within 360 days    GFR, Est African American  Date Value  Ref Range Status  11/01/2020 86 > OR = 60 mL/min/1.63m2 Final   GFR, Est Non African American  Date Value Ref Range Status  11/01/2020 74 > OR = 60 mL/min/1.35m2 Final   eGFR  Date Value Ref Range Status  07/02/2023 60 >59 mL/min/1.73 Final         Passed - Valid encounter within last 6 months    Recent Outpatient Visits           3 weeks ago Primary hypertension   [facility] Of [place]. [facility] [place], [place] W, NP   6 months ago Type 2 diabetes mellitus with hyperglycemia, without long-term current use of insulin (HCC)   [place] [facility] Dept Of [place]. [facility] Claiborne Rigg, NP   1 year ago Primary hypertension   [place] [facility] [place] - A Dept Of [place]. [facility] Claiborne Rigg, NP   1 year ago Primary hypertension   [place]  [facility] - A Dept Of [place]. [facility] Claiborne Rigg, NP   1 year ago Primary hypertension   [facility] [place] - A Dept Of [place]. [facility] Drucilla Chalet, RPH-CPP       Future Appointments             In 2 months Claiborne Rigg, NP [place] [facility] - A Dept Of  [place]. [facility]   In 5 months Comer, Belia Heman, MD [facility] - A Dept Of [place]. [facility]             hydrochlorothiazide (HYDRODIURIL) 25 MG tablet [Pharmacy Med Name: hydroCHLOROthiazide 25 MG Oral Tablet] 90 tablet 3    Sig: TAKE 1 TABLET BY MOUTH DAILY     Cardiovascular: Diuretics - Thiazide Failed - 07/24/2023 11:58 AM      Failed - Cr in normal range and within 180 days    Creat  Date Value Ref Range Status  12/10/2021 1.48 (H) 0.60 - 1.29 mg/dL Final   Creatinine, Ser  Date Value Ref Range Status  07/02/2023 1.43 (H) 0.76 - 1.27 mg/dL Final         Passed - K in normal range and within 180 days    Potassium  Date Value Ref  Range Status  07/02/2023 4.1 3.5 - 5.2 mmol/L Final         Passed - Na in normal range and within 180 days    Sodium  Date Value Ref Range Status  07/02/2023 142 134 - 144 mmol/L Final         Passed - Last BP in normal range    BP Readings from Last 1 Encounters:  07/04/23 132/86         Passed - Valid encounter within last 6 months    Recent Outpatient Visits           3 weeks ago Primary hypertension   [place] [facility] [place] [facility] Dept Of [place]. [facility] [place], [place] W, NP   6 months ago Type 2 diabetes mellitus with hyperglycemia, without long-term current use of insulin (HCC)   [place] [facility] - A Dept Of [place]. [facility] Claiborne Rigg, NP   1 year ago Primary hypertension   [place] [facility] [place] - A Dept Of [place]. [facility] Claiborne Rigg, NP   1 year ago Primary hypertension   [place] [facility] [place] - A Dept Of [place]. [facility] Claiborne Rigg, NP   1 year ago Primary hypertension   [place] [facility] [place] - A Dept Of [place]. [facility] Drucilla Chalet, RPH-CPP       Future Appointments             In 2 months Claiborne Rigg, NP  [facility] - A Dept Of [place]. [facility]   In 5 months Comer, Belia Heman, MD [facility] - A Dept Of [place]. [facility], [place]

## 2023-09-25 NOTE — Progress Notes (Signed)
 The 10-year ASCVD risk score (Arnett DK, et al., 2019) is: 16.3%   Values used to calculate the score:     Age: 48 years     Sex: Male     Is Non-Hispanic African American: Yes     Diabetic: Yes     Tobacco smoker: No     Systolic Blood Pressure: 132 mmHg     Is BP treated: Yes     HDL Cholesterol: 54 mg/dL     Total Cholesterol: 220 mg/dL  Currently prescribed pravastatin  20 mg.   Judithe Keetch, BSN, RN

## 2023-09-29 ENCOUNTER — Encounter: Payer: Self-pay | Admitting: Nurse Practitioner

## 2023-09-29 ENCOUNTER — Ambulatory Visit: Payer: BC Managed Care – PPO | Attending: Nurse Practitioner | Admitting: Nurse Practitioner

## 2023-09-29 VITALS — BP 135/80 | HR 77 | Ht 73.0 in | Wt 252.6 lb

## 2023-09-29 DIAGNOSIS — E1165 Type 2 diabetes mellitus with hyperglycemia: Secondary | ICD-10-CM | POA: Diagnosis not present

## 2023-09-29 DIAGNOSIS — Z7984 Long term (current) use of oral hypoglycemic drugs: Secondary | ICD-10-CM | POA: Diagnosis not present

## 2023-09-29 DIAGNOSIS — I10 Essential (primary) hypertension: Secondary | ICD-10-CM

## 2023-09-29 LAB — POCT GLYCOSYLATED HEMOGLOBIN (HGB A1C): HbA1c, POC (controlled diabetic range): 6.4 % (ref 0.0–7.0)

## 2023-09-29 NOTE — Progress Notes (Signed)
 Assessment & Plan:   [name] was seen today for diabetes.  Diagnoses and all orders for this visit:  Primary hypertension -     CMP14+EGFR Continue all antihypertensives as prescribed.  Reminded to bring in blood pressure log for follow  up appointment.  RECOMMENDATIONS: DASH/Mediterranean Diets are healthier choices for HTN.    Type 2 diabetes mellitus with hyperglycemia, without long-term current use of insulin (HCC) -     POCT glycosylated hemoglobin (Hb A1C) -     CMP14+EGFR Continue blood sugar control as discussed in office today, low carbohydrate diet, and regular physical exercise as tolerated, 150 minutes per week (30 min each day, 5 days per week, or 50 min 3 days per week). Keep blood sugar logs with fasting goal of 90-130 mg/dl, post prandial (after you eat) less than 180.  For Hypoglycemia: BS <60 and Hyperglycemia BS >400; contact the clinic ASAP. Annual eye exams and foot exams are recommended.     Patient has been counseled on age-appropriate routine health concerns for screening and prevention. These are reviewed and up-to-date. Referrals have been placed accordingly. Immunizations are up-to-date or declined.    Subjective:   Chief Complaint  Patient presents with   Diabetes    Edward Archer 48 y.o. male presents to office today for follow-up to diabetes and hypertension.  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       HTN Blood pressure is well controlled. He is taking HCTZ 25 mg daily and amlodipine  10 mg daily as prescribed. Home blood pressure readings are at goal of <130/80.  Weight is gradually trending down.  His goal weight is around 225.  We discussed increasing protein intake, walking goals and modified carb intake BP Readings from Last 3 Encounters:  09/29/23 135/80  07/04/23 132/86  07/02/23  127/78     DM 2 A1c at goal with currently down from 7.0.  He is currently prescribed metformin  500 mg daily. Lab Results  Component Value Date   HGBA1C 6.4 09/29/2023     Review of Systems  Constitutional:  Negative for fever, malaise/fatigue and weight loss.  HENT: Negative.  Negative for nosebleeds.   Eyes: Negative.  Negative for blurred vision, double vision and photophobia.  Respiratory: Negative.  Negative for cough and shortness of breath.   Cardiovascular: Negative.  Negative for chest pain, palpitations and leg swelling.  Gastrointestinal: Negative.  Negative for heartburn, nausea and vomiting.  Musculoskeletal: Negative.  Negative for myalgias.  Neurological: Negative.  Negative for dizziness, focal weakness, seizures and headaches.  Psychiatric/Behavioral: Negative.  Negative for suicidal ideas.     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   Outpatient Medications Prior to Visit  Medication Sig Dispense Refill   amLODipine  (NORVASC ) 10 MG tablet TAKE  1 TABLET(10 MG) BY MOUTH DAILY 90 tablet 1   Blood Glucose Monitoring Suppl DEVI 1 each by Does not apply route 2 (two) times daily. May substitute to any manufacturer covered by patient's insurance. 1 each 0   emtricitabine-rilpivir-tenofovir  AF (ODEFSEY ) 200-25-25 MG TABS tablet Take 1 tablet by mouth daily. 30 tablet 11   Glucose Blood (BLOOD GLUCOSE TEST STRIPS) STRP 1 each by In Vitro route 2 (two) times daily. May substitute to  any manufacturer covered by patient's insurance. 100 strip 6   hydrochlorothiazide  (HYDRODIURIL ) 25 MG tablet Take 1 tablet (25 mg total) by mouth daily. 90 tablet 1   metFORMIN  (GLUCOPHAGE ) 500 MG tablet Take 1 tablet (500 mg total) by mouth daily with breakfast. 90 tablet 1   pravastatin  (PRAVACHOL ) 20 MG tablet Take 1 tablet (20 mg total) by mouth daily. 90 each 3   Brimonidine Tartrate (LUMIFY OP) Apply 1 drop to eye daily as needed. (Patient not taking: Reported on 09/29/2023)     No facility-administered medications prior to visit.    No Known Allergies     Objective:    BP 135/80 (BP Location: Right Arm, Patient Position: Sitting, Cuff Size: Large)   Pulse 77   Ht 6\' 1"  (1.854 m)   Wt 252 lb 9.6 oz (114.6 kg)   BMI 33.33 kg/m  Wt Readings from Last 3 Encounters:  09/29/23 252 lb 9.6 oz (114.6 kg)  07/04/23 257 lb (116.6 kg)  07/02/23 255 lb 12.8 oz (116 kg)    Physical Exam Vitals and nursing note reviewed.  Constitutional:      Appearance: He is well-developed.  HENT:     Head: Normocephalic and atraumatic.  Cardiovascular:     Rate and Rhythm: Normal rate and regular rhythm.     Heart sounds: Normal heart sounds. No murmur heard.    No friction rub. No gallop.  Pulmonary:     Effort: Pulmonary effort is normal. No tachypnea or respiratory distress.     Breath sounds: Normal breath sounds. No decreased breath sounds, wheezing, rhonchi or rales.  Chest:     Chest wall: No tenderness.  Abdominal:     General: Bowel sounds are normal.     Palpations: Abdomen is soft.  Musculoskeletal:        General: Normal range of motion.     Cervical back: Normal range of motion.  Skin:    General: Skin is warm and dry.  Neurological:     Mental Status: He is alert and oriented to person, place, and time.     Coordination: Coordination normal.  Psychiatric:        Behavior: Behavior normal. Behavior is cooperative.        Thought Content: Thought content normal.         Judgment: Judgment normal.          Patient has been counseled extensively about nutrition and exercise as well as the importance of adherence with medications and regular follow-up. The patient was given clear instructions to go to ER or return to medical center if symptoms don't improve, worsen or new problems develop. The patient verbalized understanding.   Follow-up: Return in about 3 months (around 12/30/2023).   Collins Dean, FNP-BC [facility] [place], [place] [phone]   09/29/2023, 10:17 AM

## 2023-09-30 LAB — CMP14+EGFR
ALT: 23 IU/L (ref 0–44)
AST: 21 IU/L (ref 0–40)
Albumin: 4.3 g/dL (ref 4.1–5.1)
Alkaline Phosphatase: 50 IU/L (ref 44–121)
BUN/Creatinine Ratio: 11 (ref 9–20)
BUN: 14 mg/dL (ref 6–24)
Bilirubin Total: 0.4 mg/dL (ref 0.0–1.2)
CO2: 22 mmol/L (ref 20–29)
Calcium: 8.9 mg/dL (ref 8.7–10.2)
Chloride: 106 mmol/L (ref 96–106)
Creatinine, Ser: 1.23 mg/dL (ref 0.76–1.27)
Globulin, Total: 2.4 g/dL (ref 1.5–4.5)
Glucose: 106 mg/dL — ABNORMAL HIGH (ref 70–99)
Potassium: 4.4 mmol/L (ref 3.5–5.2)
Sodium: 143 mmol/L (ref 134–144)
Total Protein: 6.7 g/dL (ref 6.0–8.5)
eGFR: 72 mL/min/{1.73_m2} (ref >59)

## 2023-10-06 ENCOUNTER — Ambulatory Visit: Payer: Self-pay | Admitting: Nurse Practitioner

## 2023-10-23 ENCOUNTER — Other Ambulatory Visit: Payer: Self-pay | Admitting: Nurse Practitioner

## 2023-10-23 DIAGNOSIS — E7800 Pure hypercholesterolemia, unspecified: Secondary | ICD-10-CM

## 2023-12-23 ENCOUNTER — Other Ambulatory Visit: Payer: Self-pay | Admitting: Nurse Practitioner

## 2023-12-23 DIAGNOSIS — I10 Essential (primary) hypertension: Secondary | ICD-10-CM

## 2023-12-23 DIAGNOSIS — E7800 Pure hypercholesterolemia, unspecified: Secondary | ICD-10-CM

## 2023-12-23 DIAGNOSIS — E1165 Type 2 diabetes mellitus with hyperglycemia: Secondary | ICD-10-CM

## 2023-12-23 MED ORDER — HYDROCHLOROTHIAZIDE 25 MG PO TABS: 25.0000 mg | ORAL_TABLET | Freq: Every day | ORAL | 1 refills | Status: DC

## 2023-12-23 MED ORDER — METFORMIN HCL 500 MG PO TABS: 500.0000 mg | ORAL_TABLET | Freq: Every day | ORAL | 1 refills | Status: DC

## 2023-12-23 MED ORDER — AMLODIPINE BESYLATE 10 MG PO TABS: ORAL_TABLET | ORAL | 1 refills | Status: DC

## 2023-12-23 NOTE — Telephone Encounter (Signed)
 Copied from CRM [id number]. Topic: Clinical - Medication Refill >> Dec 23, 2023  3:48 PM Delon T wrote: Medication: amLODipine  (NORVASC ) 10 MG tablet hydrochlorothiazide  (HYDRODIURIL ) 25 MG tablet metFORMIN  (GLUCOPHAGE ) 500 MG tablet pravastatin  (PRAVACHOL ) 20 MG tablet  Has the patient contacted their pharmacy? No (Agent: If no, request that the patient contact the pharmacy for the refill. If patient does not wish to contact the pharmacy document the reason why and proceed with request.) (Agent: If yes, when and what did the pharmacy advise?)  This is the patient's preferred pharmacy:   [facility] [place], [place] - [address] [place] [address] Phone: [phone] Fax: [phone]  Is this the correct pharmacy for this prescription? Yes If no, delete pharmacy and type the correct one.   Has the prescription been filled recently? Yes  Is the patient out of the medication? Yes  Has the patient been seen for an appointment in the last year OR does the patient have an upcoming appointment? Yes  Can we respond through MyChart? Yes  Agent: Please be advised that Rx refills may take up to 3 business days. We ask that you follow-up with your pharmacy.

## 2024-01-01 ENCOUNTER — Ambulatory Visit: Payer: BC Managed Care – PPO | Admitting: Internal Medicine

## 2024-01-01 ENCOUNTER — Ambulatory Visit: Admitting: Infectious Diseases

## 2024-01-02 ENCOUNTER — Encounter: Payer: Self-pay | Admitting: Nurse Practitioner

## 2024-01-02 ENCOUNTER — Ambulatory Visit: Attending: Nurse Practitioner | Admitting: Nurse Practitioner

## 2024-01-02 VITALS — BP 125/81 | HR 77 | Temp 97.8°F | Resp 19 | Ht 73.0 in | Wt 251.6 lb

## 2024-01-02 DIAGNOSIS — I10 Essential (primary) hypertension: Secondary | ICD-10-CM | POA: Diagnosis not present

## 2024-01-02 NOTE — Progress Notes (Signed)
 Assessment & Plan:  Edward Archer was seen today for hypertension.  Diagnoses and all orders for this visit:  Primary hypertension Continue all antihypertensives as prescribed.  Reminded to bring in blood pressure log for follow  up appointment.  RECOMMENDATIONS: DASH/Mediterranean Diets are healthier choices for HTN.     Overweight BMI 33 He is overweight with slow weight loss despite walking and dietary changes. Current physical activity may be insufficient for significant weight loss. Dietary habits suggest intermittent fasting. - Encouraged brisk walking for 30-45 minutes, 3-4 times a week. - Advised maintaining a consistent exercise routine, possibly separate from his wife's slower pace.   Patient has been counseled on age-appropriate routine health concerns for screening and prevention. These are reviewed and up-to-date. Referrals have been placed accordingly. Immunizations are up-to-date or declined.    Subjective:   Chief Complaint  Patient presents with   Hypertension   History of Present Illness Edward Archer is a 48 year old male who presents for HTN  He is experiencing difficulty losing weight despite efforts to walk and eat right. He has lost about six pounds since February but feels the weight loss is slow. He engages in physical activities such as mowing the lawn with a push mower about three times a week, which is helping with his weight loss. He has been doing this since May or June. Regarding his diet, he tries not to eat after a certain time and often eats breakfast but not much else during the day. He sometimes feels like he is eating only once a day. He does not snack much and reports having normal bowel movements.   HTN Blood pressure is well-controlled with amlodipine  10 mg daily and hydrochlorothiazide  25 mg daily. BP Readings from Last 3 Encounters:  01/02/24 125/81  09/29/23 135/80  07/04/23 132/86     Review of Systems  Constitutional:  Negative for fever,  malaise/fatigue and weight loss.  HENT: Negative.  Negative for nosebleeds.   Eyes: Negative.  Negative for blurred vision, double vision and photophobia.  Respiratory: Negative.  Negative for cough and shortness of breath.   Cardiovascular: Negative.  Negative for chest pain, palpitations and leg swelling.  Gastrointestinal: Negative.  Negative for heartburn, nausea and vomiting.  Musculoskeletal: Negative.  Negative for myalgias.  Neurological: Negative.  Negative for dizziness, focal weakness, seizures and headaches.  Psychiatric/Behavioral: Negative.  Negative for suicidal ideas.     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   Outpatient Medications Prior to Visit  Medication Sig Dispense Refill   amLODipine  (NORVASC ) 10 MG tablet TAKE 1 TABLET(10 MG) BY MOUTH DAILY 90 tablet 1   Blood Glucose Monitoring Suppl DEVI 1 each by Does not apply route 2 (two) times daily. May substitute to any manufacturer covered by patient's insurance. 1 each 0   emtricitabine-rilpivir-tenofovir  AF (ODEFSEY ) 200-25-25 MG TABS tablet Take 1 tablet by mouth daily. 30 tablet 11  Glucose Blood (BLOOD GLUCOSE TEST STRIPS) STRP 1 each by In Vitro route 2 (two) times daily. May substitute to any manufacturer covered by patient's insurance. 100 strip 6   hydrochlorothiazide  (HYDRODIURIL ) 25 MG tablet Take 1 tablet (25 mg total) by mouth daily. 90 tablet 1   metFORMIN  (GLUCOPHAGE ) 500 MG tablet Take 1 tablet (500 mg total) by  mouth daily with breakfast. 90 tablet 1   pravastatin  (PRAVACHOL ) 20 MG tablet TAKE 1 TABLET BY MOUTH DAILY 90 tablet 3   Brimonidine Tartrate (LUMIFY OP) Apply 1 drop to eye daily as needed. (Patient not taking: Reported on 01/02/2024)     No facility-administered medications prior to visit.    No Known Allergies     Objective:    BP 125/81 (BP Location: Left Arm, Patient Position: Sitting, Cuff Size: Normal)   Pulse 77   Temp 97.8 F (36.6 C)   Resp 19   Ht 6' 1 (1.854 m)   Wt 251 lb 9.6 oz (114.1 kg)   SpO2 98%   BMI 33.19 kg/m  Wt Readings from Last 3 Encounters:  01/02/24 251 lb 9.6 oz (114.1 kg)  09/29/23 252 lb 9.6 oz (114.6 kg)  07/04/23 257 lb (116.6 kg)    Physical Exam Vitals and nursing note reviewed.  Constitutional:      Appearance: He is well-developed.  HENT:     Head: Normocephalic and atraumatic.  Cardiovascular:     Rate and Rhythm: Normal rate and regular rhythm.     Heart sounds: Normal heart sounds. No murmur heard.    No friction rub. No gallop.  Pulmonary:     Effort: Pulmonary effort is normal. No tachypnea or respiratory distress.     Breath sounds: Normal breath sounds. No decreased breath sounds, wheezing, rhonchi or rales.  Chest:     Chest wall: No tenderness.  Musculoskeletal:        General: Normal range of motion.     Cervical back: Normal range of motion.  Skin:    General: Skin is warm and dry.  Neurological:     Mental Status: He is alert and oriented to person, place, and time.     Coordination: Coordination normal.  Psychiatric:        Behavior: Behavior normal. Behavior is cooperative.        Thought Content: Thought content normal.        Judgment: Judgment normal.          Patient has been counseled extensively about nutrition and exercise as well as the importance of adherence with medications and regular follow-up. The patient was given clear instructions to go to ER or return to medical center if symptoms don't  improve, worsen or new problems develop. The patient verbalized understanding.   Follow-up: Return in about 4 months (around 05/03/2024).   Edward LELON Servant, FNP-BC [facility] [place], [place] [phone]   01/02/2024, 12:04 PM

## 2024-01-11 ENCOUNTER — Emergency Department (HOSPITAL_COMMUNITY)
Admission: EM | Admit: 2024-01-11 | Discharge: 2024-01-11 | Disposition: A | Discharge status: Home / Self Care | Attending: Emergency Medicine | Admitting: Emergency Medicine

## 2024-01-11 ENCOUNTER — Other Ambulatory Visit: Payer: Self-pay

## 2024-01-11 ENCOUNTER — Encounter (HOSPITAL_COMMUNITY): Payer: Self-pay

## 2024-01-11 DIAGNOSIS — Z21 Asymptomatic human immunodeficiency virus [HIV] infection status: Secondary | ICD-10-CM | POA: Insufficient documentation

## 2024-01-11 DIAGNOSIS — I129 Hypertensive chronic kidney disease with stage 1 through stage 4 chronic kidney disease, or unspecified chronic kidney disease: Secondary | ICD-10-CM | POA: Diagnosis not present

## 2024-01-11 DIAGNOSIS — M5441 Lumbago with sciatica, right side: Secondary | ICD-10-CM | POA: Insufficient documentation

## 2024-01-11 DIAGNOSIS — M5431 Sciatica, right side: Secondary | ICD-10-CM

## 2024-01-11 DIAGNOSIS — M545 Low back pain, unspecified: Secondary | ICD-10-CM | POA: Diagnosis present

## 2024-01-11 DIAGNOSIS — N181 Chronic kidney disease, stage 1: Secondary | ICD-10-CM | POA: Diagnosis not present

## 2024-01-11 MED ORDER — NAPROXEN 250 MG PO TABS
500.0000 mg | ORAL_TABLET | Freq: Once | ORAL | Status: AC
  Administered 2024-01-11: 500 mg via ORAL
  Filled 2024-01-11: qty 2

## 2024-01-11 MED ORDER — OXYCODONE HCL 5 MG PO TABS
5.0000 mg | ORAL_TABLET | Freq: Once | ORAL | Status: AC
  Administered 2024-01-11: 5 mg via ORAL
  Filled 2024-01-11: qty 1

## 2024-01-11 MED ORDER — LIDOCAINE 5 % EX PTCH: 1.0000 | MEDICATED_PATCH | CUTANEOUS | 0 refills | Status: DC

## 2024-01-11 MED ORDER — METHOCARBAMOL 500 MG PO TABS: 500.0000 mg | ORAL_TABLET | Freq: Three times a day (TID) | ORAL | 0 refills | Status: DC | PRN

## 2024-01-11 MED ORDER — NAPROXEN 500 MG PO TABS: 500.0000 mg | ORAL_TABLET | Freq: Two times a day (BID) | ORAL | 0 refills | Status: DC

## 2024-01-11 NOTE — Discharge Instructions (Addendum)
 You were evaluated in the Emergency Department and after careful evaluation, we did not find any emergent condition requiring admission or further testing in the hospital.  Your exam/testing today is overall reassuring.  Symptoms may be due to sciatica.  Recommend using Tylenol  1000 mg every 4-6 hours for pain.  Can use the Robaxin  muscle relaxer for more significant pain, best used at night if you are having trouble sleeping as it can cause drowsiness.  Can also use the numbing patches provided.  Please return to the Emergency Department if you experience any worsening of your condition.   Thank you for allowing us  to be a part of your care.

## 2024-01-11 NOTE — ED Provider Notes (Signed)
 [facility]-EM[facility] DEPT [facility] Emergency Department Provider Note MRN:  [id number]  Arrival date & time: 01/11/24     Chief Complaint   Leg Pain   History of Present Illness   Edward Archer is a 48 y.o. year-old male with a history of HIV, CKD presenting to the ED with chief complaint of leg pain.  Pain to the right lower back with radiation down the front and back of the right leg.  Pain for 2 days.  No numbness or weakness to the arms or legs, no bowel or bladder dysfunction, no trauma.  Review of Systems  A thorough review of systems was obtained and all systems are negative except as noted in the HPI and PMH.   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   Treated    Past Surgical History:  Procedure Laterality Date   NO PAST SURGERIES      Family History  Problem Relation Age of Onset   Hypertension Mother    Diabetes Sister    Diabetes Maternal Grandmother    Diabetes Paternal Grandmother     Social History   Socioeconomic History   Marital status: Single    Spouse name: Not on file   Number of children: Not on file   Years of education: Not on file   Highest education level: Not on file  Occupational History   Occupation: Unemployed  Tobacco Use   Smoking status: Never   Smokeless tobacco: Never  Vaping Use   Vaping status: Never Used  Substance and Sexual Activity   Alcohol use: No   Drug use: No   Sexual activity: Yes    Partners: Female    Birth control/protection: Condom    Comment: declined condoms  Other Topics Concern   Not on file  Social History Narrative   Recently discharge from [facility] July 27,2012.    Social Drivers of Corporate investment banker Strain: Low Risk  (07/02/2023)   Overall Financial Resource Strain (CARDIA)     Difficulty of Paying Living Expenses: Not hard at all  Food Insecurity: No Food Insecurity (07/02/2023)   Hunger Vital Sign    Worried About Running Out of Food in the Last Year: Never true    Ran Out of Food in the Last Year: Never true  Transportation Needs: No Transportation Needs (07/02/2023)   PRAPARE - Administrator, Civil Service (Medical): No    Lack of Transportation (Non-Medical): No  Physical Activity: Inactive (07/02/2023)   Exercise Vital Sign    Days of Exercise per Week: 0 days    Minutes of Exercise per Session: 0 min  Stress: No Stress Concern Present (07/02/2023)   [employer] of Occupational Health - Occupational Stress Questionnaire    Feeling of Stress : Not at all  Social Connections: Moderately Isolated (07/02/2023)   Social Connection and Isolation Panel    Frequency of Communication with Friends and Family: More than three times a week    Frequency of Social Gatherings with Friends and Family: Twice a week    Attends Religious Services: Never    Database administrator or Organizations: No    Attends Banker Meetings: Never    Marital Status: Married  Catering manager  Violence: Not At Risk (07/02/2023)   Humiliation, Afraid, Rape, and Kick questionnaire    Fear of Current or Ex-Partner: No    Emotionally Abused: No    Physically Abused: No    Sexually Abused: No     Physical Exam   Vitals:   01/11/24 0223  BP: (!) 157/100  Pulse: 88  Resp: 18  Temp: 98.1 F (36.7 C)  SpO2: 93%    CONSTITUTIONAL: Well-appearing, NAD NEURO/PSYCH:  Alert and oriented x 3, no focal deficits EYES:  eyes equal and reactive ENT/NECK:  no LAD, no JVD CARDIO: Regular rate, well-perfused, normal S1 and S2 PULM:  CTAB no wheezing or rhonchi GI/GU:  non-distended, non-tender MSK/SPINE:  No gross deformities, no edema SKIN:  no rash, atraumatic   *Additional and/or pertinent findings included in MDM below  Diagnostic and Interventional  Summary    EKG Interpretation Date/Time:    Ventricular Rate:    PR Interval:    QRS Duration:    QT Interval:    QTC Calculation:   R Axis:      Text Interpretation:         Labs Reviewed - No data to display  No orders to display    Medications  oxyCODONE  (Oxy IR/ROXICODONE ) immediate release tablet 5 mg (has no administration in time range)  naproxen  (NAPROSYN ) tablet 500 mg (has no administration in time range)     Procedures  /  Critical Care Procedures  ED Course and Medical Decision Making  Initial Impression and Ddx Seems consistent with sciatica.  No trauma to warrant imaging.  No red flag symptoms to suggest myelopathy.  Denies fever.  Past medical/surgical history that increases complexity of ED encounter: HIV  Interpretation of Diagnostics Laboratory and/or imaging options to aid in the diagnosis/care of the patient were considered.  After careful history and physical examination, it was determined that there was no indication for diagnostics at this time.  Patient Reassessment and Ultimate Disposition/Management     Discharge  Patient management required discussion with the following services or consulting groups:  None  Complexity of Problems Addressed Acute complicated illness or Injury  Additional Data Reviewed and Analyzed Further history obtained from: Further history from spouse/family member  Additional Factors Impacting ED Encounter Risk Prescriptions  Ozell HERO. Theadore, MD [facility] Emergency Medicine [facility] [email or web address]  Final Clinical Impressions(s) / ED Diagnoses     ICD-10-CM   1. Sciatica of right side  M54.31       ED Discharge Orders          Ordered    naproxen  (NAPROSYN ) 500 MG tablet  2 times daily,   Status:  Discontinued        01/11/24 0356    methocarbamol  (ROBAXIN ) 500 MG tablet  Every 8 hours PRN        01/11/24 0356    lidocaine  (LIDODERM ) 5 %  Every 24 hours        01/11/24 0356              Discharge Instructions Discussed with and Provided to Patient:    Discharge Instructions      You were evaluated in the Emergency Department and after careful evaluation, we did not find any emergent condition requiring admission or further testing in the hospital.  Your exam/testing today is overall reassuring.  Symptoms may be due to sciatica.  Recommend using Tylenol  1000 mg every 4-6 hours for pain.  Can use the Robaxin   muscle relaxer for more significant pain, best used at night if you are having trouble sleeping as it can cause drowsiness.  Can also use the numbing patches provided.  Please return to the Emergency Department if you experience any worsening of your condition.   Thank you for allowing us  to be a part of your care.      Theadore Ozell HERO, MD 01/11/24 [phone]

## 2024-01-11 NOTE — ED Triage Notes (Signed)
 2 days ago pt began having pain in right leg that's shoots down leg. Pt is ambulatory but with difficulty. No injury

## 2024-01-11 NOTE — ED Notes (Signed)
 PT D/C'D AFTER INSTRUCTIONS REVIEWED. PT VERBALIZED UNDERSTANDING. NAD REPORTED OR NOTED AT THIS TIME.

## 2024-03-26 ENCOUNTER — Telehealth: Payer: Self-pay | Admitting: Nurse Practitioner

## 2024-03-26 NOTE — Telephone Encounter (Signed)
 Due to a change in our office schedule, your appointment must be changed 05/03/2024 Please contact the office to resch your appt. (If pt calls please resch appt)

## 2024-04-01 ENCOUNTER — Telehealth: Payer: Self-pay | Admitting: Nurse Practitioner

## 2024-04-01 NOTE — Telephone Encounter (Signed)
 Due to a change in our office schedule, your appointment must be changed It is very important that we reach you to reschedule this appointment on 05/03/2024 if the pt calLs back please resch APPT

## 2024-05-03 ENCOUNTER — Ambulatory Visit: Admitting: Nurse Practitioner

## 2024-05-11 ENCOUNTER — Other Ambulatory Visit: Payer: Self-pay | Admitting: Nurse Practitioner

## 2024-05-11 DIAGNOSIS — I10 Essential (primary) hypertension: Secondary | ICD-10-CM

## 2024-05-11 DIAGNOSIS — E1165 Type 2 diabetes mellitus with hyperglycemia: Secondary | ICD-10-CM

## 2024-05-14 ENCOUNTER — Other Ambulatory Visit: Payer: Self-pay

## 2024-05-18 ENCOUNTER — Other Ambulatory Visit (HOSPITAL_COMMUNITY): Payer: Self-pay

## 2024-05-19 ENCOUNTER — Ambulatory Visit: Attending: Nurse Practitioner | Admitting: Nurse Practitioner

## 2024-05-19 ENCOUNTER — Encounter: Payer: Self-pay | Admitting: Nurse Practitioner

## 2024-05-19 VITALS — BP 132/83 | HR 76 | Ht 74.0 in | Wt 251.0 lb

## 2024-05-19 DIAGNOSIS — M25511 Pain in right shoulder: Secondary | ICD-10-CM

## 2024-05-19 DIAGNOSIS — Z23 Encounter for immunization: Secondary | ICD-10-CM | POA: Diagnosis not present

## 2024-05-19 DIAGNOSIS — Z7984 Long term (current) use of oral hypoglycemic drugs: Secondary | ICD-10-CM

## 2024-05-19 DIAGNOSIS — I10 Essential (primary) hypertension: Secondary | ICD-10-CM

## 2024-05-19 DIAGNOSIS — E1165 Type 2 diabetes mellitus with hyperglycemia: Secondary | ICD-10-CM | POA: Diagnosis not present

## 2024-05-19 DIAGNOSIS — G8929 Other chronic pain: Secondary | ICD-10-CM

## 2024-05-19 MED ORDER — METHOCARBAMOL 500 MG PO TABS: 500.0000 mg | ORAL_TABLET | Freq: Three times a day (TID) | ORAL | 0 refills | Status: AC | PRN

## 2024-05-19 MED ORDER — METFORMIN HCL 500 MG PO TABS: 500.0000 mg | ORAL_TABLET | Freq: Every day | ORAL | 1 refills | Status: AC

## 2024-05-19 MED ORDER — HYDROCHLOROTHIAZIDE 25 MG PO TABS: 25.0000 mg | ORAL_TABLET | Freq: Every day | ORAL | 1 refills | Status: AC

## 2024-05-19 MED ORDER — AMLODIPINE BESYLATE 10 MG PO TABS: ORAL_TABLET | ORAL | 1 refills | Status: AC
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
## 2024-05-20 LAB — CMP14+EGFR
ALT: 22 IU/L (ref 0–44)
AST: 33 IU/L (ref 0–40)
Albumin: 4.1 g/dL (ref 4.1–5.1)
Alkaline Phosphatase: 42 IU/L — ABNORMAL LOW (ref 47–123)
BUN/Creatinine Ratio: 12 (ref 9–20)
BUN: 15 mg/dL (ref 6–24)
Bilirubin Total: 0.6 mg/dL (ref 0.0–1.2)
CO2: 26 mmol/L (ref 20–29)
Calcium: 9 mg/dL (ref 8.7–10.2)
Chloride: 102 mmol/L (ref 96–106)
Creatinine, Ser: 1.28 mg/dL — ABNORMAL HIGH (ref 0.76–1.27)
Globulin, Total: 2.6 g/dL (ref 1.5–4.5)
Glucose: 124 mg/dL — ABNORMAL HIGH (ref 70–99)
Potassium: 4.1 mmol/L (ref 3.5–5.2)
Sodium: 143 mmol/L (ref 134–144)
Total Protein: 6.7 g/dL (ref 6.0–8.5)
eGFR: 69 mL/min/1.73

## 2024-05-20 LAB — MICROALBUMIN / CREATININE URINE RATIO
Creatinine, Urine: 257.2 mg/dL
Microalb/Creat Ratio: 3 mg/g{creat} (ref 0–29)
Microalbumin, Urine: 7.3 ug/mL

## 2024-05-20 LAB — HEMOGLOBIN A1C
Est. average glucose Bld gHb Est-mCnc: 148 mg/dL
Hgb A1c MFr Bld: 6.8 % — ABNORMAL HIGH (ref 4.8–5.6)

## 2024-05-25 ENCOUNTER — Ambulatory Visit: Payer: Self-pay | Admitting: Nurse Practitioner

## 2024-06-15 ENCOUNTER — Other Ambulatory Visit (HOSPITAL_COMMUNITY): Payer: Self-pay

## 2024-06-15 ENCOUNTER — Telehealth: Payer: Self-pay

## 2024-06-15 NOTE — Telephone Encounter (Signed)
 Pharmacy Patient Advocate Encounter  Insurance verification completed.   The patient is insured through [employer] for [employer]. Currently a quantity of 30 is a 30 day supply and the co-pay is $0.00 . No PA is required   This test claim was processed through [facility]- copay amounts may vary at other pharmacies due to pharmacy/plan contracts, or as the patient moves through the different stages of their insurance plan.

## 2024-11-16 ENCOUNTER — Ambulatory Visit: Payer: Self-pay | Admitting: Nurse Practitioner
